# Patient Record
Sex: Male | Born: 1970 | Race: Black or African American | Hispanic: Yes | Marital: Single | Smoking: Never smoker
Health system: Southern US, Community
[De-identification: ages and names within clinical notes are randomized; demographics above are authoritative.]

## PROBLEM LIST (undated history)

## (undated) DIAGNOSIS — J45909 Unspecified asthma, uncomplicated: Secondary | ICD-10-CM

## (undated) HISTORY — PX: HERNIA REPAIR: SHX51

## (undated) HISTORY — PX: APPENDECTOMY: SHX54

## (undated) HISTORY — PX: INGUINAL HERNIA REPAIR: SUR1180

---

## 2017-04-22 ENCOUNTER — Emergency Department (HOSPITAL_COMMUNITY)
Admission: EM | Admit: 2017-04-22 | Discharge: 2017-04-22 | Disposition: A | Discharge status: Home / Self Care | Payer: Self-pay | Attending: Emergency Medicine | Admitting: Emergency Medicine

## 2017-04-22 DIAGNOSIS — R51 Headache: Secondary | ICD-10-CM | POA: Insufficient documentation

## 2017-04-22 DIAGNOSIS — R519 Headache, unspecified: Secondary | ICD-10-CM

## 2017-04-22 DIAGNOSIS — Z79899 Other long term (current) drug therapy: Secondary | ICD-10-CM | POA: Insufficient documentation

## 2017-04-22 MED ORDER — KETOROLAC TROMETHAMINE 30 MG/ML IJ SOLN
15.0000 mg | Freq: Once | INTRAMUSCULAR | Status: AC
  Administered 2017-04-22: 15 mg via INTRAVENOUS
  Filled 2017-04-22: qty 1

## 2017-04-22 MED ORDER — SUMATRIPTAN SUCCINATE 50 MG PO TABS: 50.0000 mg | ORAL_TABLET | ORAL | 0 refills | Status: DC | PRN

## 2017-04-22 MED ORDER — PROCHLORPERAZINE EDISYLATE 5 MG/ML IJ SOLN
10.0000 mg | Freq: Once | INTRAMUSCULAR | Status: AC
  Administered 2017-04-22: 10 mg via INTRAVENOUS
  Filled 2017-04-22: qty 2

## 2017-04-22 MED ORDER — SODIUM CHLORIDE 0.9 % IV BOLUS (SEPSIS)
1000.0000 mL | Freq: Once | INTRAVENOUS | Status: AC
  Administered 2017-04-22: 1000 mL via INTRAVENOUS

## 2017-04-22 NOTE — ED Triage Notes (Signed)
Pt reports history of migraines and has been having a headache for 1 week. Pt states this headache has been waking him up at night and keeping him up all day. Pt has been taking OTC headache medications with no relief.

## 2017-04-22 NOTE — Discharge Instructions (Signed)
As discussed, your evaluation today has been largely reassuring.  But, it is important that you monitor your condition carefully, and do not hesitate to return to the ED if you develop new, or concerning changes in your condition. ? ?Otherwise, please follow-up with your physician for appropriate ongoing care. ? ?

## 2017-04-22 NOTE — ED Provider Notes (Signed)
[facility] EMERGENCY DEPARTMENT Provider Note   CSN: [id number] Arrival date & time: 04/22/17  3419     History   Chief Complaint Chief Complaint  Patient presents with  . Migraine    HPI Kenyan Karnes is a 46 y.o. male.  HPI  Patient presents with concern of headache. Headache has been present for about 2 weeks, has been adamantly severe, but persistently present. Headache is in a typical distribution for him with history of prior migraine. Headache is most severe with flares in the anterior left. There is no new nausea, vomiting, photophobia, confusion, disorientation, weakness in his extremities, incontinence. Patient has had no relief with OTC medication. Patient was recently moved here from [place], has no primary care physician, no access to health care currently.   No past medical history on file.  There are no active problems to display for this patient.   No past surgical history on file.     Home Medications    Prior to Admission medications   Medication Sig Start Date End Date Taking? Authorizing Provider  albuterol (PROVENTIL HFA;VENTOLIN HFA) 108 (90 Base) MCG/ACT inhaler Inhale 2 puffs into the lungs every 6 (six) hours as needed for wheezing or shortness of breath.   Yes [provider]  Multiple Vitamins-Minerals (MULTIVITAMIN WITH MINERALS) tablet Take 1 tablet by mouth daily.   Yes [provider]  SUMAtriptan (IMITREX) 50 MG tablet Take 1 tablet (50 mg total) by mouth every 2 (two) hours as needed for migraine. May repeat (one time) in 2 hours if headache persists or recurs 04/22/17   Carmin Muskrat, MD    Family History No family history on file.  Social History Social History  Substance Use Topics  . Smoking status: Not on file  . Smokeless tobacco: Not on file  . Alcohol use Not on file     Allergies   Shellfish allergy   Review of Systems Review of Systems  Constitutional:       Per  HPI, otherwise negative  HENT:       Per HPI, otherwise negative  Respiratory:       Per HPI, otherwise negative  Cardiovascular:       Per HPI, otherwise negative  Gastrointestinal: Negative for vomiting.  Endocrine:       Negative aside from HPI  Genitourinary:       Neg aside from HPI   Musculoskeletal:       Per HPI, otherwise negative  Skin: Negative.   Neurological: Positive for headaches. Negative for syncope.     Physical Exam Updated Vital Signs BP (!) 153/80   Pulse 84   Temp 98.6 F (37 C) (Oral)   Resp 17   SpO2 92%   Physical Exam  Constitutional: He is oriented to person, place, and time. He appears well-developed. No distress.  HENT:  Head: Normocephalic and atraumatic.  Eyes: Conjunctivae and EOM are normal.  Cardiovascular: Normal rate and regular rhythm.   Pulmonary/Chest: Effort normal. No stridor. No respiratory distress.  Abdominal: He exhibits no distension.  Musculoskeletal: He exhibits no edema.  Neurological: He is alert and oriented to person, place, and time. He displays no atrophy and no tremor. No cranial nerve deficit. He exhibits normal muscle tone. He displays no seizure activity.  Skin: Skin is warm and dry.  Psychiatric: He has a normal mood and affect.  Nursing note and vitals reviewed.    ED Treatments / Results   Procedures Procedures (  including critical care time)  Medications Ordered in ED Medications  sodium chloride 0.9 % bolus 1,000 mL (0 mLs Intravenous Stopped 04/22/17 1102)  ketorolac (TORADOL) 30 MG/ML injection 15 mg (15 mg Intravenous Given 04/22/17 1014)  prochlorperazine (COMPAZINE) injection 10 mg (10 mg Intravenous Given 04/22/17 1012)     Initial Impression / Assessment and Plan / ED Course  I have reviewed the triage vital signs and the nursing notes.  Pertinent labs & imaging results that were available during my care of the patient were reviewed by me and considered in my medical decision making (see  chart for details).    On repeat exam the patient is awake and alert, in no distress.  Headache has improved. With no red flags, and with a history of migraines, the patient was provided outpatient follow-up referral, and prescription for migraine headaches.   Final Clinical Impressions(s) / ED Diagnoses   Final diagnoses:  Bad headache    New Prescriptions New Prescriptions   SUMATRIPTAN (IMITREX) 50 MG TABLET    Take 1 tablet (50 mg total) by mouth every 2 (two) hours as needed for migraine. May repeat (one time) in 2 hours if headache persists or recurs     Carmin Muskrat, MD 04/22/17 1156

## 2018-02-06 ENCOUNTER — Other Ambulatory Visit: Payer: Self-pay

## 2018-02-06 ENCOUNTER — Emergency Department (HOSPITAL_COMMUNITY)
Admission: EM | Admit: 2018-02-06 | Discharge: 2018-02-06 | Disposition: A | Discharge status: Home / Self Care | Payer: BLUE CROSS/BLUE SHIELD | Attending: Emergency Medicine | Admitting: Emergency Medicine

## 2018-02-06 ENCOUNTER — Emergency Department (HOSPITAL_COMMUNITY): Payer: BLUE CROSS/BLUE SHIELD

## 2018-02-06 ENCOUNTER — Encounter (HOSPITAL_COMMUNITY): Payer: Self-pay | Admitting: *Deleted

## 2018-02-06 DIAGNOSIS — R748 Abnormal levels of other serum enzymes: Secondary | ICD-10-CM | POA: Diagnosis not present

## 2018-02-06 DIAGNOSIS — N23 Unspecified renal colic: Secondary | ICD-10-CM | POA: Insufficient documentation

## 2018-02-06 DIAGNOSIS — Z79899 Other long term (current) drug therapy: Secondary | ICD-10-CM | POA: Diagnosis not present

## 2018-02-06 DIAGNOSIS — R1031 Right lower quadrant pain: Secondary | ICD-10-CM | POA: Diagnosis present

## 2018-02-06 LAB — URINALYSIS, ROUTINE W REFLEX MICROSCOPIC
BILIRUBIN URINE: NEGATIVE
Bacteria, UA: NONE SEEN
Glucose, UA: NEGATIVE mg/dL
KETONES UR: 5 mg/dL — AB
LEUKOCYTES UA: NEGATIVE
NITRITE: NEGATIVE
PH: 6 (ref 5.0–8.0)
Protein, ur: NEGATIVE mg/dL
SPECIFIC GRAVITY, URINE: 1.017 (ref 1.005–1.030)

## 2018-02-06 LAB — COMPREHENSIVE METABOLIC PANEL
ALBUMIN: 4.3 g/dL (ref 3.5–5.0)
ALK PHOS: 51 U/L (ref 38–126)
ALT: 106 U/L — AB (ref 0–44)
ANION GAP: 15 (ref 5–15)
AST: 80 U/L — ABNORMAL HIGH (ref 15–41)
BILIRUBIN TOTAL: 0.9 mg/dL (ref 0.3–1.2)
BUN: 12 mg/dL (ref 6–20)
CALCIUM: 9.5 mg/dL (ref 8.9–10.3)
CO2: 22 mmol/L (ref 22–32)
CREATININE: 1.02 mg/dL (ref 0.61–1.24)
Chloride: 101 mmol/L (ref 98–111)
GFR calc Af Amer: >60 mL/min (ref >60)
GFR calc non Af Amer: >60 mL/min (ref >60)
GLUCOSE: 143 mg/dL — AB (ref 70–99)
Potassium: 3.4 mmol/L — ABNORMAL LOW (ref 3.5–5.1)
Sodium: 138 mmol/L (ref 135–145)
TOTAL PROTEIN: 7.4 g/dL (ref 6.5–8.1)

## 2018-02-06 LAB — CBC
HEMATOCRIT: 40.9 % (ref 39.0–52.0)
HEMOGLOBIN: 14.1 g/dL (ref 13.0–17.0)
MCH: 31.2 pg (ref 26.0–34.0)
MCHC: 34.5 g/dL (ref 30.0–36.0)
MCV: 90.5 fL (ref 78.0–100.0)
PLATELETS: 199 10*3/uL (ref 150–400)
RBC: 4.52 MIL/uL (ref 4.22–5.81)
RDW: 12 % (ref 11.5–15.5)
WBC: 6.4 10*3/uL (ref 4.0–10.5)

## 2018-02-06 LAB — LIPASE, BLOOD: Lipase: 41 U/L (ref 11–51)

## 2018-02-06 MED ORDER — SODIUM CHLORIDE 0.9 % IV BOLUS
1000.0000 mL | Freq: Once | INTRAVENOUS | Status: AC
  Administered 2018-02-06: 1000 mL via INTRAVENOUS

## 2018-02-06 MED ORDER — HYDROMORPHONE HCL 1 MG/ML IJ SOLN
0.5000 mg | Freq: Once | INTRAMUSCULAR | Status: AC
  Administered 2018-02-06: 0.5 mg via INTRAVENOUS
  Filled 2018-02-06: qty 1

## 2018-02-06 MED ORDER — OXYCODONE-ACETAMINOPHEN 5-325 MG PO TABS: 1.0000 | ORAL_TABLET | ORAL | 0 refills | Status: DC | PRN

## 2018-02-06 MED ORDER — ONDANSETRON HCL 4 MG/2ML IJ SOLN
4.0000 mg | Freq: Once | INTRAMUSCULAR | Status: AC
  Administered 2018-02-06: 4 mg via INTRAVENOUS
  Filled 2018-02-06: qty 2

## 2018-02-06 MED ORDER — TAMSULOSIN HCL 0.4 MG PO CAPS: 0.4000 mg | ORAL_CAPSULE | Freq: Every day | ORAL | 0 refills | Status: DC

## 2018-02-06 MED ORDER — NAPROXEN 500 MG PO TABS: 500.0000 mg | ORAL_TABLET | Freq: Two times a day (BID) | ORAL | 0 refills | Status: DC

## 2018-02-06 MED ORDER — KETOROLAC TROMETHAMINE 15 MG/ML IJ SOLN
15.0000 mg | Freq: Once | INTRAMUSCULAR | Status: AC
  Administered 2018-02-06: 15 mg via INTRAVENOUS
  Filled 2018-02-06: qty 1

## 2018-02-06 NOTE — ED Provider Notes (Signed)
[facility] EMERGENCY DEPARTMENT Provider Note   CSN: [id number] Arrival date & time: 02/06/18  0232     History   Chief Complaint Chief Complaint  Patient presents with  . Abdominal Pain    HPI Stephen Lozano is a 47 y.o. male.  Patient with history of appendectomy at age 35 presents to the emergency department with acute onset of right lower quadrant abdominal pain with radiation to the groin.  Pain began acutely at about 11 PM.  It was intermittent at first but became more persistent at about 1:30 AM.  Patient had some right back and flank pain at onset.  He has had nausea but no vomiting.  Pain has been severe at times.  No treatments prior to arrival.  No previous history of kidney stones.  Patient admits to occasional alcohol use, most recently last night.  No dysuria or hematuria reported.     History reviewed. No pertinent past medical history.  There are no active problems to display for this patient.   History reviewed. No pertinent surgical history.      Home Medications    Prior to Admission medications   Medication Sig Start Date End Date Taking? Authorizing Provider  albuterol (PROVENTIL HFA;VENTOLIN HFA) 108 (90 Base) MCG/ACT inhaler Inhale 2 puffs into the lungs every 6 (six) hours as needed for wheezing or shortness of breath.   Yes [provider]  Multiple Vitamins-Minerals (MULTIVITAMIN WITH MINERALS) tablet Take 1 tablet by mouth daily.   Yes [provider]  SUMAtriptan (IMITREX) 50 MG tablet Take 1 tablet (50 mg total) by mouth every 2 (two) hours as needed for migraine. May repeat (one time) in 2 hours if headache persists or recurs 04/22/17  Yes Carmin Muskrat, MD    Family History No family history on file.  Social History Social History   Tobacco Use  . Smoking status: Never Smoker  . Smokeless tobacco: Never Used  Substance Use Topics  . Alcohol use: Never    Frequency: Never  . Drug use: Not on  file     Allergies   Shellfish allergy   Review of Systems Review of Systems  Constitutional: Negative for fever.  HENT: Negative for rhinorrhea and sore throat.   Eyes: Negative for redness.  Respiratory: Negative for cough.   Cardiovascular: Negative for chest pain.  Gastrointestinal: Positive for abdominal pain. Negative for diarrhea, nausea and vomiting.  Genitourinary: Positive for flank pain. Negative for dysuria, frequency, hematuria, testicular pain and urgency.  Musculoskeletal: Positive for back pain. Negative for myalgias.  Skin: Negative for rash.  Neurological: Negative for headaches.     Physical Exam Updated Vital Signs BP (!) 151/89 (BP Location: Left Arm)   Pulse 69   Temp 98 F (36.7 C) (Oral)   Resp 20   Ht 6' (1.829 m)   Wt 96.6 kg (213 lb)   SpO2 97%   BMI 28.89 kg/m   Physical Exam  Constitutional: He appears well-developed and well-nourished. He appears distressed (slightly).  HENT:  Head: Normocephalic and atraumatic.  Eyes: Conjunctivae are normal. Right eye exhibits no discharge. Left eye exhibits no discharge.  Neck: Normal range of motion. Neck supple.  Cardiovascular: Normal rate, regular rhythm and normal heart sounds.  Pulmonary/Chest: Effort normal and breath sounds normal.  Abdominal: Soft. There is tenderness in the right lower quadrant. There is no rebound, no guarding and no CVA tenderness.  Neurological: He is alert.  Skin: Skin is warm and dry.  Psychiatric: He has a normal mood and affect.  Nursing note and vitals reviewed.    ED Treatments / Results  Labs (all labs ordered are listed, but only abnormal results are displayed) Labs Reviewed  COMPREHENSIVE METABOLIC PANEL - Abnormal; Notable for the following components:      Result Value   Potassium 3.4 (*)    Glucose, Bld 143 (*)    AST 80 (*)    ALT 106 (*)    All other components within normal limits  URINALYSIS, ROUTINE W REFLEX MICROSCOPIC - Abnormal; Notable  for the following components:   Hgb urine dipstick SMALL (*)    Ketones, ur 5 (*)    All other components within normal limits  LIPASE, BLOOD  CBC    EKG EKG Interpretation  Date/Time:  Saturday February 06 2018 02:40:01 EDT Ventricular Rate:  69 PR Interval:  188 QRS Duration: 120 QT Interval:  392 QTC Calculation: 420 R Axis:   -31 Text Interpretation:  Normal sinus rhythm Left axis deviation Right bundle branch block Abnormal ECG No previous ECGs available Confirmed by Ripley Fraise [phone]) on 02/06/2018 5:03:40 AM   Radiology No results found.  Procedures Procedures (including critical care time)  Medications Ordered in ED Medications  HYDROmorphone (DILAUDID) injection 0.5 mg (0.5 mg Intravenous Given 02/06/18 0642)  ondansetron (ZOFRAN) injection 4 mg (4 mg Intravenous Given 02/06/18 0642)  sodium chloride 0.9 % bolus 1,000 mL (0 mLs Intravenous Stopped 02/06/18 0756)  ketorolac (TORADOL) 15 MG/ML injection 15 mg (15 mg Intravenous Given 02/06/18 0823)     Initial Impression / Assessment and Plan / ED Course  I have reviewed the triage vital signs and the nursing notes.  Pertinent labs & imaging results that were available during my care of the patient were reviewed by me and considered in my medical decision making (see chart for details).     Patient seen and examined.  Clinical symptoms suggestive of right sided ureteral colic.  Patient with no previous history.  CT renal protocol ordered to evaluate further.  Patient has mildly elevated transaminases which may be likely due to recent alcohol use.  He has no focal right upper quadrant pain to suggest gallbladder disease at this time.  Vital signs reviewed and are as follows: BP (!) 151/89 (BP Location: Left Arm)   Pulse 69   Temp 98 F (36.7 C) (Oral)   Resp 20   Ht 6' (1.829 m)   Wt 96.6 kg (213 lb)   SpO2 97%   BMI 28.89 kg/m   8:29 AM patient's pain is much improved.  Will give dose of Toradol to help  prevent pain from returning.  Patient updated on CT imaging results.  We also discussed findings of fatty liver infiltration with elevated liver enzymes and the need to have these followed up by primary care doctor.  Discussed that this may be genetic or exacerbated by alcohol.  Patient states that he recently moved here and does not yet have a doctor.  Will give referrals.  Anticipate discharge to home if pain remains improved.  Patient counseled on kidney stone treatment. Urged patient to strain urine and save any stones. Urged urology follow-up and return to [facility] with any complications. Counseled patient to maintain good fluid intake.   Counseled patient on use of Flomax.   Patient counseled on use of narcotic pain medications. Counseled not to combine these medications with others containing tylenol. Urged not to drink alcohol, drive, or perform any other  activities that requires focus while taking these medications. The patient verbalizes understanding and agrees with the plan.  9:06 AM patient doing well.  Ready for discharge.  Questions answered.   Final Clinical Impressions(s) / ED Diagnoses   Final diagnoses:  Ureteral colic  Elevated liver enzymes   Ureteral colic: Patient with right-sided renal colic, confirmed on CT scan.  Pain well controlled in the emergency department.  Stone is 2 mm and expect that he will pass this with time.  Urology follow-up given.  Home with symptomatic treatment and Flomax.  No concern for pyelonephritis.  Elevated liver enzymes: Patient with fatty liver infiltration noted on the CT scan.  Patient has no previous diagnosis.  Liver enzymes slightly elevated today.  Patient strongly encouraged to follow-up with primary care doctor to have these rechecked and monitored.  ED Discharge Orders        Ordered    naproxen (NAPROSYN) 500 MG tablet  2 times daily     02/06/18 0903    oxyCODONE-acetaminophen (PERCOCET/ROXICET) 5-325 MG tablet  Every 4 hours PRN       02/06/18 0903    tamsulosin (FLOMAX) 0.4 MG CAPS capsule  Daily     02/06/18 0903       Carlisle Cater, PA-C 02/06/18 2376    Virgel Manifold, MD 02/07/18 [phone]

## 2018-02-06 NOTE — ED Triage Notes (Signed)
Th pt is c/o rt lower abd pain  At 2330  Sudden onset  No vomiting nauseated writhing in pain at triage

## 2018-02-06 NOTE — ED Notes (Signed)
Patient transported to CT 

## 2018-02-06 NOTE — ED Notes (Signed)
Patient verbalizes understanding of discharge instructions. Opportunity for questioning and answers were provided. Armband removed by staff, pt discharged from ED.  

## 2018-02-06 NOTE — Discharge Instructions (Signed)
Please read and follow all provided instructions.  Your diagnoses today include:  1. Ureteral colic   2. Elevated liver enzymes     Tests performed today include:  Urine test that showed blood in your urine and no infection  CT scan which showed a 2 millimeter kidney on the right side, also fatty infiltration of the liver  Blood test that showed normal kidney function, elevated liver enzymes which will need to be rechecked and followed by a primary care doctor  Vital signs. See below for your results today.   Medications prescribed:   Percocet (oxycodone/acetaminophen) - narcotic pain medication  DO NOT drive or perform any activities that require you to be awake and alert because this medicine can make you drowsy. BE VERY CAREFUL not to take multiple medicines containing Tylenol (also called acetaminophen). Doing so can lead to an overdose which can damage your liver and cause liver failure and possibly death.   Naproxen - anti-inflammatory pain medication  Do not exceed 500mg  naproxen every 12 hours, take with food  You have been prescribed an anti-inflammatory medication or NSAID. Take with food. Take smallest effective dose for the shortest duration needed for your pain. Stop taking if you experience stomach pain or vomiting.    Flomax (tamsulosin) - relaxes smooth muscle to help kidney stones pass  Take any prescribed medications only as directed.  Home care instructions:  Follow any educational materials contained in this packet.  Please double your fluid intake for the next several days. Strain your urine and save any stones that may pass.   BE VERY CAREFUL not to take multiple medicines containing Tylenol (also called acetaminophen). Doing so can lead to an overdose which can damage your liver and cause liver failure and possibly death.   Follow-up instructions: Please follow-up with your urologist or the urologist referral (provided on front page) in the next 1  week for further evaluation of your symptoms.  Return instructions:  If you need to return to the Emergency Department, go to [facility] and not [facility]. The urologists are located at [facility] and can better care for you at this location.   Please return to the Emergency Department if you experience worsening symptoms.  Please return if you develop fever or uncontrolled pain or vomiting.  Please return if you have any other emergent concerns.  Additional Information:  Your vital signs today were: BP 131/74    Pulse 67    Temp 98 F (36.7 C) (Oral)    Resp (!) 22    Ht 6' (1.829 m)    Wt 96.6 kg (213 lb)    SpO2 98%    BMI 28.89 kg/m  If your blood pressure (BP) was elevated above 135/85 this visit, please have this repeated by your doctor within one month. --------------

## 2020-04-18 ENCOUNTER — Other Ambulatory Visit: Payer: Self-pay

## 2020-04-18 ENCOUNTER — Emergency Department (HOSPITAL_COMMUNITY): Payer: Self-pay

## 2020-04-18 ENCOUNTER — Emergency Department (HOSPITAL_COMMUNITY)
Admission: EM | Admit: 2020-04-18 | Discharge: 2020-04-19 | Disposition: A | Discharge status: Home / Self Care | Payer: Self-pay | Attending: Emergency Medicine | Admitting: Emergency Medicine

## 2020-04-18 ENCOUNTER — Ambulatory Visit (HOSPITAL_COMMUNITY)
Admission: EM | Admit: 2020-04-18 | Discharge: 2020-04-18 | Disposition: A | Discharge status: Home / Self Care | Payer: Self-pay | Attending: Internal Medicine | Admitting: Internal Medicine

## 2020-04-18 ENCOUNTER — Encounter (HOSPITAL_COMMUNITY): Payer: Self-pay

## 2020-04-18 DIAGNOSIS — R079 Chest pain, unspecified: Secondary | ICD-10-CM

## 2020-04-18 DIAGNOSIS — J45909 Unspecified asthma, uncomplicated: Secondary | ICD-10-CM | POA: Insufficient documentation

## 2020-04-18 DIAGNOSIS — R072 Precordial pain: Secondary | ICD-10-CM | POA: Insufficient documentation

## 2020-04-18 HISTORY — DX: Unspecified asthma, uncomplicated: J45.909

## 2020-04-18 LAB — CBC
HCT: 45 % (ref 39.0–52.0)
Hemoglobin: 15 g/dL (ref 13.0–17.0)
MCH: 31.3 pg (ref 26.0–34.0)
MCHC: 33.3 g/dL (ref 30.0–36.0)
MCV: 93.8 fL (ref 80.0–100.0)
Platelets: 203 10*3/uL (ref 150–400)
RBC: 4.8 MIL/uL (ref 4.22–5.81)
RDW: 11.9 % (ref 11.5–15.5)
WBC: 6.3 10*3/uL (ref 4.0–10.5)
nRBC: 0 % (ref 0.0–0.2)

## 2020-04-18 LAB — BASIC METABOLIC PANEL
Anion gap: 10 (ref 5–15)
BUN: 10 mg/dL (ref 6–20)
CO2: 24 mmol/L (ref 22–32)
Calcium: 9.7 mg/dL (ref 8.9–10.3)
Chloride: 104 mmol/L (ref 98–111)
Creatinine, Ser: 0.93 mg/dL (ref 0.61–1.24)
GFR, Estimated: >60 mL/min (ref >60)
Glucose, Bld: 89 mg/dL (ref 70–99)
Potassium: 4 mmol/L (ref 3.5–5.1)
Sodium: 138 mmol/L (ref 135–145)

## 2020-04-18 LAB — TROPONIN I (HIGH SENSITIVITY)
Troponin I (High Sensitivity): 9 ng/L (ref <18)
Troponin I (High Sensitivity): 11 ng/L (ref <18)

## 2020-04-18 NOTE — ED Triage Notes (Signed)
Pt reports Sunday he woke up with L sided numbness, which has continued in arm only until present. Also reports L sided non-radiating chest pain. Went to [facility] but sent here for further eval.

## 2020-04-18 NOTE — Discharge Instructions (Signed)
Patient is advised to go to the ED for further evaluation of chest pain

## 2020-04-18 NOTE — ED Notes (Signed)
Patient is being discharged from the Urgent Care and sent to the Emergency Department via POV. Per Dr. Lanny Cramp, patient is in need of higher level of care due to CP, left arm numbness. Patient is aware and verbalizes understanding of plan of care.  Vitals:   04/18/20 1559  BP: (!) 186/84  Pulse: 75  Resp: 18  Temp: 98.9 F (37.2 C)  SpO2: 96%

## 2020-04-18 NOTE — ED Triage Notes (Signed)
Pt c/o left arm numbness/tingling and left leg numbness onset upon waking up Sunday morning. Pt reports left leg numbness improved by Sunday night, states left arm numbness/tingling persists. Also reports mid-sternal/epigastric pain "like someone is stepping on my chest" onset Monday evening, pain to left scapula pain. CP is not reproducible on palpation, no change with inspiration/movement., Pt states h/o "heart irritation", but reports that current CP is very different in characteristic. Previous CP felt like "tight band".   Denies n/v, SOB, diaphoresis, slurred speech, blurred vision, weakness to left side.  Speech clear, grips strong, left slightly weaker than right, PERRLA, smile symmetrical.  EKG performed and given to Dr. Lanny Cramp

## 2020-04-18 NOTE — ED Provider Notes (Signed)
[facility]    CSN: [id number] Arrival date & time: 04/18/20  1528      History   Chief Complaint Chief Complaint  Patient presents with  . Chest Pain  . left arm numbness    HPI Stephen Lozano is a 49 y.o. male severe urgent care with 3-day history of left arm numbness and tingling which started after patient woke up out of bed.  He denies any weakness in the left upper extremity.  No facial numbness or leg numbness.  No facial deviation.  Patient denies any difficulty finding his words feeling confused.  Symptoms have persisted over the past 3 days.  He admits to sleeping in the very awkward position.  No rash.  Patient also complains of lower chest pain which is intermittent in nature.  Pain is mild to moderate in intensity and is currently about a 3 out of 10.  No radiation of pain.  Is not associated with any oral intake.  No nausea or vomiting.  No shortness of breath or wheezing.  Pain is currently pressure type.  Last stress test was about 5 years ago.  He has some cardiac history.Marland Kitchen   HPI  Past Medical History:  Diagnosis Date  . Asthma     There are no problems to display for this patient.   Past Surgical History:  Procedure Laterality Date  . APPENDECTOMY    . HERNIA REPAIR    . INGUINAL HERNIA REPAIR Right        Home Medications    Prior to Admission medications   Medication Sig Start Date End Date Taking? Authorizing Provider  albuterol (PROVENTIL HFA;VENTOLIN HFA) 108 (90 Base) MCG/ACT inhaler Inhale 2 puffs into the lungs every 6 (six) hours as needed for wheezing or shortness of breath.    [provider]  Multiple Vitamins-Minerals (MULTIVITAMIN WITH MINERALS) tablet Take 1 tablet by mouth daily.    [provider]  naproxen (NAPROSYN) 500 MG tablet Take 1 tablet (500 mg total) by mouth 2 (two) times daily. 02/06/18   Carlisle Cater, PA-C  oxyCODONE-acetaminophen (PERCOCET/ROXICET) 5-325 MG tablet Take 1-2 tablets by  mouth every 4 (four) hours as needed for severe pain. 02/06/18   Carlisle Cater, PA-C  SUMAtriptan (IMITREX) 50 MG tablet Take 1 tablet (50 mg total) by mouth every 2 (two) hours as needed for migraine. May repeat (one time) in 2 hours if headache persists or recurs 04/22/17   Carmin Muskrat, MD  tamsulosin (FLOMAX) 0.4 MG CAPS capsule Take 1 capsule (0.4 mg total) by mouth daily. 02/06/18   Carlisle Cater, PA-C    Family History Family History  Problem Relation Age of Onset  . Diabetes Mother   . Diabetes Father     Social History Social History   Tobacco Use  . Smoking status: Never Smoker  . Smokeless tobacco: Never Used  Vaping Use  . Vaping Use: Never used  Substance Use Topics  . Alcohol use: Yes    Comment: occasional  . Drug use: Never     Allergies   Shellfish allergy   Review of Systems Review of Systems  Constitutional: Negative.   HENT: Negative.   Respiratory: Negative for cough and shortness of breath.   Cardiovascular: Positive for chest pain. Negative for palpitations.  Gastrointestinal: Negative.   Genitourinary: Negative.  Negative for dysuria, frequency and urgency.  Neurological: Positive for headaches. Negative for dizziness, facial asymmetry and light-headedness.     Physical Exam Triage Vital Signs ED  Triage Vitals  Enc Vitals Group     BP 04/18/20 1559 (!) 186/84     Pulse Rate 04/18/20 1559 75     Resp 04/18/20 1559 18     Temp 04/18/20 1559 98.9 F (37.2 C)     Temp Source 04/18/20 1559 Oral     SpO2 04/18/20 1559 96 %     Weight --      Height --      Head Circumference --      Peak Flow --      Pain Score 04/18/20 1600 4     Pain Loc --      Pain Edu? --      Excl. in [place]? --    No data found.  Updated Vital Signs BP (!) 186/84 (BP Location: Right Arm)   Pulse 75   Temp 98.9 F (37.2 C) (Oral)   Resp 18   SpO2 96%   Visual Acuity Right Eye Distance:   Left Eye Distance:   Bilateral Distance:    Right Eye Near:     Left Eye Near:    Bilateral Near:     Physical Exam Vitals and nursing note reviewed.  Constitutional:      General: He is not in acute distress.    Appearance: He is not ill-appearing.  Cardiovascular:     Rate and Rhythm: Normal rate and regular rhythm.     Heart sounds: Normal heart sounds.  Pulmonary:     Effort: Pulmonary effort is normal.     Breath sounds: Normal breath sounds. No decreased breath sounds, wheezing or rhonchi.  Abdominal:     General: Bowel sounds are normal.     Palpations: Abdomen is soft.  Neurological:     Mental Status: He is alert.      UC Treatments / Results  Labs (all labs ordered are listed, but only abnormal results are displayed) Labs Reviewed - No data to display  EKG   Radiology DG Chest 2 View  Result Date: 04/18/2020 CLINICAL DATA:  49 year old male with chest pain. EXAM: CHEST - 2 VIEW COMPARISON:  None. FINDINGS: The lungs are clear. There is no pleural effusion pneumothorax. The cardiac silhouette is within limits. No acute osseous pathology. Pectus excavatum. IMPRESSION: No active cardiopulmonary disease. Electronically Signed   By: Anner Crete M.D.   On: 04/18/2020 17:18    Procedures Procedures (including critical care time)  Medications Ordered in UC Medications - No data to display  Initial Impression / Assessment and Plan / UC Course  I have reviewed the triage vital signs and the nursing notes.  Pertinent labs & imaging results that were available during my care of the patient were reviewed by me and considered in my medical decision making (see chart for details).     1.  Precordial chest pain: EKG showed normal sinus rhythm with left axis deviation.  Incomplete right bundle branch block. Patient continues to have some chest discomfort at this time I encouraged the patient to go to the emergency department to be evaluated further Patient agrees with the plan.  2.  Left upper extremity numbness likely  cervical radiculopathy: Patient can get further work-up in the ED. Final Clinical Impressions(s) / UC Diagnoses   Final diagnoses:  Chest pain, unspecified type     Discharge Instructions     Patient is advised to go to the ED for further evaluation of chest pain    ED Prescriptions    None  PDMP not reviewed this encounter.   Chase Picket, MD 04/18/20 [phone]

## 2020-04-19 MED ORDER — LIDOCAINE VISCOUS HCL 2 % MT SOLN
15.0000 mL | Freq: Once | OROMUCOSAL | Status: AC
  Administered 2020-04-19: 15 mL via ORAL
  Filled 2020-04-19: qty 15

## 2020-04-19 MED ORDER — ALUM & MAG HYDROXIDE-SIMETH 200-200-20 MG/5ML PO SUSP
30.0000 mL | Freq: Once | ORAL | Status: AC
  Administered 2020-04-19: 30 mL via ORAL
  Filled 2020-04-19: qty 30

## 2020-04-19 NOTE — ED Notes (Signed)
Pt's fiance would like a call back from charge nurse. [place] [phone]

## 2020-04-19 NOTE — ED Provider Notes (Signed)
[facility] EMERGENCY DEPARTMENT Provider Note  CSN: [id number] Arrival date & time: 04/18/20 1630  Chief Complaint(s) Chest Pain and Numbness  HPI Stephen Lozano is a 49 y.o. male here with chest pain  Onset/Duration: 4 days (Sun), gradual Timing: constant, unchanged Location: precordial, radiating to left arm Quality: pressure Severity: moderate Modifying Factors:  Improved by: leaning forward  Worsened by: sitting up Associated Signs/Symptoms:  Pertinent (+): none  Pertinent (-): SOB, fever, recent infection, cough, abd pain, N/V Context: h/o prior pericarditis. Pain similar. Last episode 3-4 yrs ago.   HPI  Past Medical History Past Medical History:  Diagnosis Date  . Asthma    There are no problems to display for this patient.  Home Medication(s) Prior to Admission medications   Medication Sig Start Date End Date Taking? Authorizing Provider  albuterol (PROVENTIL HFA;VENTOLIN HFA) 108 (90 Base) MCG/ACT inhaler Inhale 2 puffs into the lungs every 6 (six) hours as needed for wheezing or shortness of breath.    [provider]  Multiple Vitamins-Minerals (MULTIVITAMIN WITH MINERALS) tablet Take 1 tablet by mouth daily.    [provider]  naproxen (NAPROSYN) 500 MG tablet Take 1 tablet (500 mg total) by mouth 2 (two) times daily. 02/06/18   Carlisle Cater, PA-C  oxyCODONE-acetaminophen (PERCOCET/ROXICET) 5-325 MG tablet Take 1-2 tablets by mouth every 4 (four) hours as needed for severe pain. 02/06/18   Carlisle Cater, PA-C  SUMAtriptan (IMITREX) 50 MG tablet Take 1 tablet (50 mg total) by mouth every 2 (two) hours as needed for migraine. May repeat (one time) in 2 hours if headache persists or recurs 04/22/17   Carmin Muskrat, MD  tamsulosin (FLOMAX) 0.4 MG CAPS capsule Take 1 capsule (0.4 mg total) by mouth daily. 02/06/18   Carlisle Cater, PA-C                                                                                                                                     Past Surgical History Past Surgical History:  Procedure Laterality Date  . APPENDECTOMY    . HERNIA REPAIR    . INGUINAL HERNIA REPAIR Right    Family History Family History  Problem Relation Age of Onset  . Diabetes Mother   . Diabetes Father     Social History Social History   Tobacco Use  . Smoking status: Never Smoker  . Smokeless tobacco: Never Used  Vaping Use  . Vaping Use: Never used  Substance Use Topics  . Alcohol use: Yes    Comment: occasional  . Drug use: Never   Allergies Shellfish allergy  Review of Systems Review of Systems All other systems are reviewed and are negative for acute change except as noted in the HPI  Physical Exam Vital Signs  I have reviewed the triage vital signs BP (!) 164/77   Pulse 65   Temp 98 F (36.7 C)   Resp 20  Ht 6' (1.829 m)   Wt 96.2 kg   SpO2 97%   BMI 28.75 kg/m   Physical Exam Vitals reviewed.  Constitutional:      General: He is not in acute distress.    Appearance: He is well-developed. He is not diaphoretic.  HENT:     Head: Normocephalic and atraumatic.     Nose: Nose normal.  Eyes:     General: No scleral icterus.       Right eye: No discharge.        Left eye: No discharge.     Conjunctiva/sclera: Conjunctivae normal.     Pupils: Pupils are equal, round, and reactive to light.  Cardiovascular:     Rate and Rhythm: Normal rate and regular rhythm.     Heart sounds: No murmur heard.  No friction rub. No gallop.   Pulmonary:     Effort: Pulmonary effort is normal. No respiratory distress.     Breath sounds: Normal breath sounds. No stridor. No rales.  Abdominal:     General: There is no distension.     Palpations: Abdomen is soft.     Tenderness: There is no abdominal tenderness.  Musculoskeletal:        General: No tenderness.     Cervical back: Normal range of motion and neck supple.  Skin:    General: Skin is warm and dry.     Findings: No  erythema or rash.  Neurological:     Mental Status: He is alert and oriented to person, place, and time.     ED Results and Treatments Labs (all labs ordered are listed, but only abnormal results are displayed) Labs Reviewed  BASIC METABOLIC PANEL  CBC  TROPONIN I (HIGH SENSITIVITY)  TROPONIN I (HIGH SENSITIVITY)                                                                                                                         EKG  EKG Interpretation  Date/Time:    Ventricular Rate:    PR Interval:    QRS Duration:   QT Interval:    QTC Calculation:   R Axis:     Text Interpretation:        Radiology DG Chest 2 View  Result Date: 04/18/2020 CLINICAL DATA:  49 year old male with chest pain. EXAM: CHEST - 2 VIEW COMPARISON:  None. FINDINGS: The lungs are clear. There is no pleural effusion pneumothorax. The cardiac silhouette is within limits. No acute osseous pathology. Pectus excavatum. IMPRESSION: No active cardiopulmonary disease. Electronically Signed   By: Anner Crete M.D.   On: 04/18/2020 17:18    Pertinent labs & imaging results that were available during my care of the patient were reviewed by me and considered in my medical decision making (see chart for details).  Medications Ordered in ED Medications  alum & mag hydroxide-simeth (MAALOX/MYLANTA) 200-200-20 MG/5ML suspension 30 mL (30 mLs Oral Given 04/19/20 0248)    And  lidocaine (  XYLOCAINE) 2 % viscous mouth solution 15 mL (15 mLs Oral Given 04/19/20 0248)                                                                                                                                    Procedures Procedures  (including critical care time)  Medical Decision Making / ED Course I have reviewed the nursing notes for this encounter and the patient's prior records (if available in EHR or on provided paperwork).   Stephen Lozano was evaluated in Emergency Department on 04/19/2020 for the symptoms  described in the history of present illness. He was evaluated in the context of the global COVID-19 pandemic, which necessitated consideration that the patient might be at risk for infection with the SARS-CoV-2 virus that causes COVID-19. Institutional protocols and algorithms that pertain to the evaluation of patients at risk for COVID-19 are in a state of rapid change based on information released by regulatory bodies including the CDC and federal and state organizations. These policies and algorithms were followed during the patient's care in the ED.  Atypical chest pain EKG from UC in the system notable for new TWI in lateral leads when compared to tracing from 2019. Pain has been constant for 4 days.  Serial trops were both negative. Unlikely ACS given the negative trops with duration of pain. Low suspicion for pulmonary embolism.  Presentation not classic for aortic dissection or esophageal perforation. Chest x-ray without evidence suggestive of pneumonia, pneumothorax, pneumomediastinum.  No abnormal contour of the mediastinum to suggest dissection. No evidence of acute injuries.  Likely recurrence of his pericarditis vs GI etiology.  He was given GI cocktail which did provide patient with relief.  Recommended continued treatment with GI meds.  If pain persist then he can take the ibuprofen.  Referral to cardiology for new EKG changes and stress testing.      Final Clinical Impression(s) / ED Diagnoses Final diagnoses:  Precordial pain   The patient appears reasonably screened and/or stabilized for discharge and I doubt any other medical condition or other [facility] requiring further screening, evaluation, or treatment in the ED at this time prior to discharge. Safe for discharge with strict return precautions.  Disposition: Discharge  Condition: Good  I have discussed the results, Dx and Tx plan with the patient/family who expressed understanding and agree(s) with the plan. Discharge  instructions discussed at length. The patient/family was given strict return precautions who verbalized understanding of the instructions. No further questions at time of discharge.    ED Discharge Orders         Ordered    Ambulatory referral to Cardiology        04/19/20 0328           Follow Up: [place] [id number] [phone] Call  To schedule an appointment for close follow up  Primary  care provider  Schedule an appointment as soon as possible for a visit  If you do not have a primary care physician, contact [facility] at [phone] for referral     This chart was dictated using voice recognition software.  Despite best efforts to proofread,  errors can occur which can change the documentation meaning.   Fatima Blank, MD 04/19/20 [phone]

## 2020-05-08 NOTE — Progress Notes (Signed)
Cardiology Office Note:    Date:  05/10/2020   ID:  Stephen Lozano, DOB February 18, 1971, MRN [id number]  PCP:  Patient, No Pcp Per  [place] Cardiologist:  No primary care provider on file.  [facility] Electrophysiologist:  None   Referring MD: Fatima Blank,*    History of Present Illness:    Stephen Lozano is a 49 y.o. 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 Leonette Monarch for evaluation of chest pain.  Patient recently presented to ED on 10/13 with 3 day history of left arm numbness as well as intermittent chest pain. ECG showed NSR with incomplete RBBB, LVH with borderline left axis. Trop 9-->1. He was instructed to follow-up with cardiology for further management.  Patient states that about 2-3 weeks ago, he woke up and was having intermittent chest pain and tingling sensation in left arm. Thought that he may have slept wrong. He went to work and tried to push through but similar episode with leg pain occurred the next day. This prompted him to go to the ED as above.  He states that he is very active and is on his feet all the time (walks 82miles per day, bikes 2 miles per day) and has no exertional symptoms. No chest pain, SOB, lightheadedness, nausea, orthopnea or PND. He had a treadmill stress test in the past which was reportedly normal. Has not had recurrent chest pain since that one episode.  Past Medical History:  Diagnosis Date  . Asthma     Past Surgical History:  Procedure Laterality Date  . APPENDECTOMY    . HERNIA REPAIR    . INGUINAL HERNIA REPAIR Right     Current Medications: Current Meds  Medication Sig  . albuterol (PROVENTIL HFA;VENTOLIN HFA) 108 (90 Base) MCG/ACT inhaler Inhale 2 puffs into the lungs every 6 (six) hours as needed for wheezing or shortness of breath.  . Multiple Vitamins-Minerals  (MULTIVITAMIN WITH MINERALS) tablet Take 1 tablet by mouth daily.     Allergies:   Shellfish allergy   Social History   Socioeconomic History  . Marital status: Single    Spouse name: Not on file  . Number of children: Not on file  . Years of education: Not on file  . Highest education level: Not on file  Occupational History  . Not on file  Tobacco Use  . Smoking status: Never Smoker  . Smokeless tobacco: Never Used  Vaping Use  . Vaping Use: Never used  Substance and Sexual Activity  . Alcohol use: Yes    Comment: occasional  . Drug use: Never  . Sexual activity: Yes  Other Topics Concern  . Not on file  Social History Narrative  . Not on file   Social Determinants of Health   Financial Resource Strain:   . Difficulty of Paying Living Expenses: Not on file  Food Insecurity:   . Worried About Charity fundraiser in the Last Year: Not on file  . Ran Out of Food in the Last Year: Not on file  Transportation Needs:   . Lack of Transportation (Medical): Not on file  . Lack of Transportation (Non-Medical): Not on file  Physical Activity:   . Days of Exercise per Week: Not on file  . Minutes of Exercise per Session: Not on file  Stress:   .  Feeling of Stress : Not on file  Social Connections:   . Frequency of Communication with Friends and Family: Not on file  . Frequency of Social Gatherings with Friends and Family: Not on file  . Attends Religious Services: Not on file  . Active Member of Clubs or Organizations: Not on file  . Attends Archivist Meetings: Not on file  . Marital Status: Not on file     Family History: The patient's family history includes Diabetes in his father and mother.  ROS:   Please see the history of present illness.    Review of Systems  Constitutional: Negative for chills and fever.  HENT: Negative for hearing loss.   Eyes: Negative for blurred vision.  Respiratory: Negative for cough and shortness of breath.    Cardiovascular: Positive for chest pain. Negative for palpitations, orthopnea, claudication, leg swelling and PND.  Gastrointestinal: Positive for heartburn. Negative for nausea and vomiting.  Genitourinary: Negative for flank pain.  Musculoskeletal: Negative for myalgias.  Neurological: Negative for dizziness and loss of consciousness.  Psychiatric/Behavioral: Negative for substance abuse.    EKGs/Labs/Other Studies Reviewed:    The following studies were reviewed today: No prior cardiac study  EKG:  ECG 04/19/20: NSR with RBBB.  Recent Labs: 04/18/2020: BUN 10; Creatinine, Ser 0.93; Hemoglobin 15.0; Platelets 203; Potassium 4.0; Sodium 138  Recent Lipid Panel No results found for: CHOL, TRIG, HDL, CHOLHDL, VLDL, LDLCALC, LDLDIRECT   Risk Assessment/Calculations:       Physical Exam:    VS:  BP (!) 160/72   Pulse 81   Ht 6' (1.829 m)   Wt 219 lb (99.3 kg)   SpO2 96%   BMI 29.70 kg/m     Wt Readings from Last 3 Encounters:  05/10/20 219 lb (99.3 kg)  04/18/20 212 lb (96.2 kg)  02/06/18 213 lb (96.6 kg)     GEN:  Well nourished, well developed in no acute distress HEENT: Normal NECK: No JVD; No carotid bruits LYMPHATICS: No lymphadenopathy CARDIAC: RRR, 2/6 systolic murmur best heard at RUSB, No rubs, gallops RESPIRATORY:  Clear to auscultation without rales, wheezing or rhonchi  ABDOMEN: Soft, non-tender, non-distended MUSCULOSKELETAL:  No edema; No deformity  SKIN: Warm and dry NEUROLOGIC:  Alert and oriented x 3 PSYCHIATRIC:  Normal affect   ASSESSMENT:    1. Murmur, cardiac   2. Chest pain of uncertain etiology   3. Primary hypertension   4. Pericarditis, unspecified chronicity, unspecified type    PLAN:    In order of problems listed above:  #Non cardiac chest pain: Patient had episode of chest pain with arm numbness when he woke up approximately 2 weeks ago. Pain subsided but did not completely go away throughout the day. Woke up the next  morning and continued to have the chest pain as well as left leg numbness which prompted him to go to the ED. There ECG with RBBB but no ischemia. Trops negative. Chest pain abated on it's own. No exertional symptoms and patient remains very active without exercise limitations. Do not suspect cardiac etiology of chest pain given history and reassuring work-up. Will continue to monitor at this time and proceed with risk factor modification. -Check cholesterol panel -Manage HTN as below -Continue management of GERD and recurrent pericarditis -Continue to monitor; if symptoms recur or become associated with exercise, will proceed with stress testing at that time  #Recurrent Pericarditis: Diagnosed in 2016. Has had 3 episodes in 5 years. -Continue NSAIDs as needed  for acute flares -TTE as below  #Systolic murmur: Patient with 2/6 systolic murmur on exam. Given episode of chest pain, uncontrolled HTN and murmur, will check TTE. -Obtain TTE  #HTN: Patient with multiple elevated blood pressures in the ED and here in the office.  -Start amlodipine 5mg  daily -Keep log at home and notify me if the SBP>120s/80s   Medication Adjustments/Labs and Tests Ordered: Current medicines are reviewed at length with the patient today.  Concerns regarding medicines are outlined above.  Orders Placed This Encounter  Procedures  . Lipid panel  . ECHOCARDIOGRAM COMPLETE   Meds ordered this encounter  Medications  . amLODipine (NORVASC) 5 MG tablet    Sig: Take 1 tablet (5 mg total) by mouth daily.    Dispense:  90 tablet    Refill:  3    Patient Instructions  Medication Instructions:  Your physician has recommended you make the following change in your medication:  1-START Amlodipine 5 mg by mouth daily.  *If you need a refill on your cardiac medications before your next appointment, please call your pharmacy*  Lab Work: Your physician recommends that you return for lab work in: 1 week for fasting  lipid panel.  If you have labs (blood work) drawn today and your tests are completely normal, you will receive your results only by: Marland Kitchen MyChart Message (if you have MyChart) OR . A paper copy in the mail If you have any lab test that is abnormal or we need to change your treatment, we will call you to review the results.  Testing/Procedures: Your physician has requested that you have an echocardiogram. Echocardiography is a painless test that uses sound waves to create images of your heart. It provides your doctor with information about the size and shape of your heart and how well your heart's chambers and valves are working. This procedure takes approximately one hour. There are no restrictions for this procedure.  Follow-Up: At [facility], you and your health needs are our priority.  As part of our continuing mission to provide you with exceptional heart care, we have created designated Provider Care Teams.  These Care Teams include your primary Cardiologist (physician) and Advanced Practice Providers (APPs -  Physician Assistants and Nurse Practitioners) who all work together to provide you with the care you need, when you need it.  We recommend signing up for the patient portal called "MyChart".  Sign up information is provided on this After Visit Summary.  MyChart is used to connect with patients for Virtual Visits (Telemedicine).  Patients are able to view lab/test results, encounter notes, upcoming appointments, etc.  Non-urgent messages can be sent to your provider as well.   To learn more about what you can do with MyChart, go to [email or web address].    Your next appointment:   3 month(s)  The format for your next appointment:   In Person  Provider:   You may see Dr. Johney Frame or one of the following Advanced Practice Providers on your designated Care Team:    Richardson Dopp, PA-C  Robbie Lis, [place]        Signed, Freada Bergeron, MD  05/10/2020 4:38 PM    [place]

## 2020-05-10 ENCOUNTER — Ambulatory Visit (INDEPENDENT_AMBULATORY_CARE_PROVIDER_SITE_OTHER): Payer: Self-pay | Admitting: Cardiology

## 2020-05-10 ENCOUNTER — Other Ambulatory Visit: Payer: Self-pay

## 2020-05-10 ENCOUNTER — Encounter: Payer: Self-pay | Admitting: Cardiology

## 2020-05-10 VITALS — BP 160/72 | HR 81 | Ht 72.0 in | Wt 219.0 lb

## 2020-05-10 DIAGNOSIS — I10 Essential (primary) hypertension: Secondary | ICD-10-CM

## 2020-05-10 DIAGNOSIS — R079 Chest pain, unspecified: Secondary | ICD-10-CM

## 2020-05-10 DIAGNOSIS — R011 Cardiac murmur, unspecified: Secondary | ICD-10-CM

## 2020-05-10 DIAGNOSIS — I319 Disease of pericardium, unspecified: Secondary | ICD-10-CM

## 2020-05-10 MED ORDER — AMLODIPINE BESYLATE 5 MG PO TABS: 5.0000 mg | ORAL_TABLET | Freq: Every day | ORAL | 3 refills | Status: DC

## 2020-05-10 NOTE — Patient Instructions (Addendum)
Medication Instructions:  Your physician has recommended you make the following change in your medication:  1-START Amlodipine 5 mg by mouth daily.  *If you need a refill on your cardiac medications before your next appointment, please call your pharmacy*  Lab Work: Your physician recommends that you return for lab work in: 1 week for fasting lipid panel.  If you have labs (blood work) drawn today and your tests are completely normal, you will receive your results only by:  [place] (if you have MyChart) OR  A paper copy in the mail If you have any lab test that is abnormal or we need to change your treatment, we will call you to review the results.  Testing/Procedures: Your physician has requested that you have an echocardiogram. Echocardiography is a painless test that uses sound waves to create images of your heart. It provides your doctor with information about the size and shape of your heart and how well your hearts chambers and valves are working. This procedure takes approximately one hour. There are no restrictions for this procedure.  Follow-Up: At [facility], you and your health needs are our priority.  As part of our continuing mission to provide you with exceptional heart care, we have created designated Provider Care Teams.  These Care Teams include your primary Cardiologist (physician) and Advanced Practice Providers (APPs -  Physician Assistants and Nurse Practitioners) who all work together to provide you with the care you need, when you need it.  We recommend signing up for the patient portal called "MyChart".  Sign up information is provided on this After Visit Summary.  MyChart is used to connect with patients for Virtual Visits (Telemedicine).  Patients are able to view lab/test results, encounter notes, upcoming appointments, etc.  Non-urgent messages can be sent to your provider as well.   To learn more about what you can do with MyChart, go to  [email or web address].    Your next appointment:   3 month(s)  The format for your next appointment:   In Person  Provider:   You may see Dr. Johney Frame or one of the following Advanced Practice Providers on your designated Care Team:    Richardson Dopp, PA-C  [place], [place]

## 2020-05-18 ENCOUNTER — Other Ambulatory Visit: Payer: Self-pay

## 2020-06-05 ENCOUNTER — Other Ambulatory Visit (HOSPITAL_COMMUNITY): Payer: Self-pay

## 2020-06-05 ENCOUNTER — Other Ambulatory Visit: Payer: Self-pay

## 2020-06-27 ENCOUNTER — Other Ambulatory Visit: Payer: Self-pay | Admitting: *Deleted

## 2020-06-27 ENCOUNTER — Ambulatory Visit (HOSPITAL_COMMUNITY): Payer: Self-pay | Attending: Cardiology

## 2020-06-27 ENCOUNTER — Other Ambulatory Visit: Payer: Self-pay

## 2020-06-27 DIAGNOSIS — R011 Cardiac murmur, unspecified: Secondary | ICD-10-CM | POA: Insufficient documentation

## 2020-06-27 LAB — LIPID PANEL
Chol/HDL Ratio: 5.5 ratio — ABNORMAL HIGH (ref 0.0–5.0)
Cholesterol, Total: 220 mg/dL — ABNORMAL HIGH (ref 100–199)
HDL: 40 mg/dL (ref >39)
LDL Chol Calc (NIH): 140 mg/dL — ABNORMAL HIGH (ref 0–99)
Triglycerides: 220 mg/dL — ABNORMAL HIGH (ref 0–149)
VLDL Cholesterol Cal: 40 mg/dL (ref 5–40)

## 2020-06-27 LAB — ECHOCARDIOGRAM COMPLETE
Area-P 1/2: 3.24 cm2
S' Lateral: 2.5 cm

## 2020-08-13 NOTE — Progress Notes (Deleted)
Cardiology Office Note:    Date:  08/13/2020   ID:  Stephen Lozano, DOB 1970/09/20, MRN [id number]  PCP:  Patient, No Pcp Per  [facility] Cardiologist:  No primary care provider on file.  [facility] Electrophysiologist:  None   Referring MD: No ref. provider found     History of Present Illness:    Stephen Lozano is a 50 y.o. 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  Patient presented to ED on 04/18/20 with 3 day history of left arm numbness as well as intermittent chest pain. ECG showed NSR with incomplete RBBB, LVH with borderline left axis. Trop 9-->1. He was discharged home and seen by me on 05/10/20 where he was doing well. No recurrence of symptoms. Remained very active without limitations. Given his history of recurrent pericarditis, we recommended TTE which showed normal BiV function, no effusion, no significant valvular abnormalities.  Past Medical History:  Diagnosis Date  . Asthma     Past Surgical History:  Procedure Laterality Date  . APPENDECTOMY    . HERNIA REPAIR    . INGUINAL HERNIA REPAIR Right     Current Medications: No outpatient medications have been marked as taking for the 08/16/20 encounter (Appointment) with Freada Bergeron, MD.     Allergies:   Shellfish allergy   Social History   Socioeconomic History  . Marital status: Single    Spouse name: Not on file  . Number of children: Not on file  . Years of education: Not on file  . Highest education level: Not on file  Occupational History  . Not on file  Tobacco Use  . Smoking status: Never Smoker  . Smokeless tobacco: Never Used  Vaping Use  . Vaping Use: Never used  Substance and Sexual Activity  . Alcohol use: Yes    Comment: occasional  . Drug use: Never  . Sexual activity: Yes  Other Topics Concern  . Not on file   Social History Narrative  . Not on file   Social Determinants of Health   Financial Resource Strain: Not on file  Food Insecurity: Not on file  Transportation Needs: Not on file  Physical Activity: Not on file  Stress: Not on file  Social Connections: Not on file     Family History: The patient's ***family history includes Diabetes in his father and mother.  ROS:   Please see the history of present illness.    *** All other systems reviewed and are negative.  EKGs/Labs/Other Studies Reviewed:    The following studies were reviewed today: 1. Left ventricular ejection fraction, by estimation, is 60 to 65%. The  left ventricle has normal function.  2. The left ventricle has no regional wall motion abnormalities.  3. There is mild concentric left ventricular hypertrophy.  4. Left ventricular diastolic parameters were normal.  5. Right ventricular systolic function is normal. The right ventricular  size is normal. There is normal pulmonary artery systolic pressure.  6. The mitral valve is normal in structure. Trivial mitral valve  regurgitation. No evidence of mitral stenosis.  7. The aortic valve is tricuspid. Aortic valve regurgitation is not  visualized.  8. The inferior vena cava is normal in size with greater than 50%  respiratory variability, suggesting right atrial pressure of 3 mmHg.  EKG:  EKG is *** ordered  today.  The ekg ordered today demonstrates ***  Recent Labs: 04/18/2020: BUN 10; Creatinine, Ser 0.93; Hemoglobin 15.0; Platelets 203; Potassium 4.0; Sodium 138  Recent Lipid Panel    Component Value Date/Time   CHOL 220 (H) 06/27/2020 0830   TRIG 220 (H) 06/27/2020 0830   HDL 40 06/27/2020 0830   CHOLHDL 5.5 (H) 06/27/2020 0830   LDLCALC 140 (H) 06/27/2020 0830     Risk Assessment/Calculations:   {Does this patient have ATRIAL FIBRILLATION?:[phone]}   Physical Exam:    VS:  There were no vitals taken for this visit.    Wt Readings from  Last 3 Encounters:  05/10/20 219 lb (99.3 kg)  04/18/20 212 lb (96.2 kg)  02/06/18 213 lb (96.6 kg)     GEN: *** Well nourished, well developed in no acute distress HEENT: Normal NECK: No JVD; No carotid bruits LYMPHATICS: No lymphadenopathy CARDIAC: ***RRR, no murmurs, rubs, gallops RESPIRATORY:  Clear to auscultation without rales, wheezing or rhonchi  ABDOMEN: Soft, non-tender, non-distended MUSCULOSKELETAL:  No edema; No deformity  SKIN: Warm and dry NEUROLOGIC:  Alert and oriented x 3 PSYCHIATRIC:  Normal affect   ASSESSMENT:    No diagnosis found. PLAN:    In order of problems listed above:  #Non cardiac chest pain: Patient had episode of chest pain with arm numbness when he woke up approximately 2 weeks ago. Pain subsided but did not completely go away throughout the day. Woke up the next morning and continued to have the chest pain as well as left leg numbness which prompted him to go to the ED. There ECG with RBBB but no ischemia. Trops negative. Chest pain abated on it's own. No exertional symptoms and patient remains very active without exercise limitations. Do not suspect cardiac etiology of chest pain given history and reassuring work-up. Will continue to monitor at this time and proceed with risk factor modification. -Manage HTN as below -Continue management of GERD and recurrent pericarditis -Continue to monitor; if symptoms recur or become associated with exercise, will proceed with stress testing at that time  #Recurrent Pericarditis: Diagnosed in 2016. Has had 3 episodes in 5 years. -Continue NSAIDs as needed for acute flares -TTE with normal BiV function, no significant effusion   #HTN: -Continue amlodipine 5mg  daily -Keep log at home and notify me if the SBP>120s/80s   {Are you ordering a CV Procedure (e.g. stress test, cath, DCCV, TEE, etc)?   Press F2        :[id number]}    Medication Adjustments/Labs and Tests Ordered: Current medicines are  reviewed at length with the patient today.  Concerns regarding medicines are outlined above.  No orders of the defined types were placed in this encounter.  No orders of the defined types were placed in this encounter.   There are no Patient Instructions on file for this visit.   Signed, Freada Bergeron, MD  08/13/2020 1:26 PM    [facility]

## 2020-08-16 ENCOUNTER — Ambulatory Visit: Payer: Self-pay | Admitting: Cardiology

## 2020-09-29 ENCOUNTER — Other Ambulatory Visit: Payer: Self-pay

## 2020-09-29 ENCOUNTER — Ambulatory Visit (HOSPITAL_COMMUNITY)
Admission: EM | Admit: 2020-09-29 | Discharge: 2020-09-29 | Disposition: A | Discharge status: Home / Self Care | Payer: Self-pay | Attending: Student | Admitting: Student

## 2020-09-29 ENCOUNTER — Encounter (HOSPITAL_COMMUNITY): Payer: Self-pay

## 2020-09-29 DIAGNOSIS — J4520 Mild intermittent asthma, uncomplicated: Secondary | ICD-10-CM

## 2020-09-29 DIAGNOSIS — J029 Acute pharyngitis, unspecified: Secondary | ICD-10-CM

## 2020-09-29 DIAGNOSIS — H6691 Otitis media, unspecified, right ear: Secondary | ICD-10-CM

## 2020-09-29 MED ORDER — AMOXICILLIN-POT CLAVULANATE 875-125 MG PO TABS: 1.0000 | ORAL_TABLET | Freq: Two times a day (BID) | ORAL | 0 refills | Status: DC

## 2020-09-29 NOTE — ED Provider Notes (Signed)
[facility]    CSN: [id number] Arrival date & time: 09/29/20  1420      History   Chief Complaint Chief Complaint  Patient presents with  . Ear Pain  . Sore Throat    HPI Stephen Lozano is a 50 y.o. male presenting with sore throat and right ear pain. History asthma, currently well controlled on occasional albuterol. Denies fevers/chills, n/v/d, shortness of breath, chest pain, cough, congestion, facial pain, teeth pain, headaches, loss of taste/smell, swollen lymph nodes, trouble swallowing, tinnitus, dizziness, hearing changes. Denies seasonal allergic rhinitis.   HPI  Past Medical History:  Diagnosis Date  . Asthma     There are no problems to display for this patient.   Past Surgical History:  Procedure Laterality Date  . APPENDECTOMY    . HERNIA REPAIR    . INGUINAL HERNIA REPAIR Right        Home Medications    Prior to Admission medications   Medication Sig Start Date End Date Taking? Authorizing Provider  amoxicillin-clavulanate (AUGMENTIN) 875-125 MG tablet Take 1 tablet by mouth every 12 (twelve) hours. 09/29/20  Yes Hazel Sams, PA-C  albuterol (PROVENTIL HFA;VENTOLIN HFA) 108 (90 Base) MCG/ACT inhaler Inhale 2 puffs into the lungs every 6 (six) hours as needed for wheezing or shortness of breath.    [provider]  amLODipine (NORVASC) 5 MG tablet Take 1 tablet (5 mg total) by mouth daily. 05/10/20   Freada Bergeron, MD  Multiple Vitamins-Minerals (MULTIVITAMIN WITH MINERALS) tablet Take 1 tablet by mouth daily.    [provider]    Family History Family History  Problem Relation Age of Onset  . Diabetes Mother   . Diabetes Father     Social History Social History   Tobacco Use  . Smoking status: Never Smoker  . Smokeless tobacco: Never Used  Vaping Use  . Vaping Use: Never used  Substance Use Topics  . Alcohol use: Yes    Comment: occasional  . Drug use: Never     Allergies   Shellfish  allergy   Review of Systems Review of Systems  Constitutional: Negative for appetite change, chills and fever.  HENT: Positive for ear pain and sore throat. Negative for congestion, ear discharge, rhinorrhea, sinus pressure and sinus pain.   Eyes: Negative for redness and visual disturbance.  Respiratory: Negative for cough, chest tightness, shortness of breath and wheezing.   Cardiovascular: Negative for chest pain and palpitations.  Gastrointestinal: Negative for abdominal pain, constipation, diarrhea, nausea and vomiting.  Genitourinary: Negative for dysuria, frequency and urgency.  Musculoskeletal: Negative for myalgias.  Neurological: Negative for dizziness, weakness and headaches.  Psychiatric/Behavioral: Negative for confusion.  All other systems reviewed and are negative.    Physical Exam Triage Vital Signs ED Triage Vitals  Enc Vitals Group     BP 09/29/20 1453 (!) 166/89     Pulse Rate 09/29/20 1445 68     Resp 09/29/20 1445 18     Temp 09/29/20 1445 98.3 F (36.8 C)     Temp Source 09/29/20 1445 Oral     SpO2 09/29/20 1445 98 %     Weight --      Height --      Head Circumference --      Peak Flow --      Pain Score 09/29/20 1444 6     Pain Loc --      Pain Edu? --      Excl. in  GC? --    No data found.  Updated Vital Signs BP (!) 166/89   Pulse 68   Temp 98.3 F (36.8 C) (Oral)   Resp 18   SpO2 98%   Visual Acuity Right Eye Distance:   Left Eye Distance:   Bilateral Distance:    Right Eye Near:   Left Eye Near:    Bilateral Near:     Physical Exam Vitals reviewed.  Constitutional:      General: He is not in acute distress.    Appearance: Normal appearance. He is not ill-appearing.  HENT:     Head: Normocephalic and atraumatic.     Right Ear: Hearing and ear canal normal. No swelling or tenderness. There is no impacted cerumen. There is mastoid tenderness. Tympanic membrane is erythematous. Tympanic membrane is not perforated, retracted or  bulging.     Left Ear: Hearing, tympanic membrane, ear canal and external ear normal. No swelling or tenderness. There is no impacted cerumen. No mastoid tenderness. Tympanic membrane is not perforated, erythematous, retracted or bulging.     Nose:     Right Sinus: No maxillary sinus tenderness or frontal sinus tenderness.     Left Sinus: No maxillary sinus tenderness or frontal sinus tenderness.     Mouth/Throat:     Mouth: Mucous membranes are moist.     Pharynx: Uvula midline. Posterior oropharyngeal erythema present. No oropharyngeal exudate.     Tonsils: No tonsillar exudate. 1+ on the right. 1+ on the left.     Comments: Smooth erythema posterior pharynx Cardiovascular:     Rate and Rhythm: Normal rate and regular rhythm.     Heart sounds: Normal heart sounds.  Pulmonary:     Breath sounds: Normal breath sounds and air entry. No wheezing, rhonchi or rales.  Chest:     Chest wall: No tenderness.  Abdominal:     General: Abdomen is flat. Bowel sounds are normal.     Tenderness: There is no abdominal tenderness. There is no guarding or rebound.  Lymphadenopathy:     Cervical: No cervical adenopathy.  Neurological:     General: No focal deficit present.     Mental Status: He is alert and oriented to person, place, and time.  Psychiatric:        Attention and Perception: Attention and perception normal.        Mood and Affect: Mood and affect normal.        Behavior: Behavior normal. Behavior is cooperative.        Thought Content: Thought content normal.        Judgment: Judgment normal.      UC Treatments / Results  Labs (all labs ordered are listed, but only abnormal results are displayed) Labs Reviewed - No data to display  EKG   Radiology No results found.  Procedures Procedures (including critical care time)  Medications Ordered in UC Medications - No data to display  Initial Impression / Assessment and Plan / UC Course  I have reviewed the triage vital  signs and the nursing notes.  Pertinent labs & imaging results that were available during my care of the patient were reviewed by me and considered in my medical decision making (see chart for details).     This patient is a 50 year old male presenting with AOM and viral tonsillitis. Today this pt is afebrile nontachycardic nontachypneic, oxygenating well on room air, no wheezes rhonchi or rales.   Plan to treat with augmentin as  below. Asthma- continue prn albuterol Centor score 1, rapid strep deferred, particularly as we are already treating with augmentin for AOM. covid declined.   Return precautions discussed.  This chart was dictated using voice recognition software, Dragon. Despite the best efforts of this provider to proofread and correct errors, errors may still occur which can change documentation meaning.  Final Clinical Impressions(s) / UC Diagnoses   Final diagnoses:  Acute otitis media, right  Acute pharyngitis, unspecified etiology  Mild intermittent asthma without complication     Discharge Instructions     -Start the antibiotic-Augmentin (amoxicillin-clavulanate), 1 pill every 12 hours.  You can take this with food like with breakfast and dinner. -For further relief of pain- Take Tylenol 1000 mg up to 3 times daily, and ibuprofen 800 mg up to 3 times daily with food.  You can take these together, or alternate every 3-4 hours. -If your symptoms get worse instead of better despite treatment, seek additional medical attention.  This includes worsening of ear pain, hearing changes, dizziness, shortness of breath, trouble swallowing, chest pain.     ED Prescriptions    Medication Sig Dispense Auth. Provider   amoxicillin-clavulanate (AUGMENTIN) 875-125 MG tablet Take 1 tablet by mouth every 12 (twelve) hours. 14 tablet Hazel Sams, PA-C     PDMP not reviewed this encounter.   Hazel Sams, PA-C 09/29/20 1547

## 2020-09-29 NOTE — Discharge Instructions (Addendum)
-  Start the antibiotic-Augmentin (amoxicillin-clavulanate), 1 pill every 12 hours.  You can take this with food like with breakfast and dinner. -For further relief of pain- Take Tylenol 1000 mg up to 3 times daily, and ibuprofen 800 mg up to 3 times daily with food.  You can take these together, or alternate every 3-4 hours. -If your symptoms get worse instead of better despite treatment, seek additional medical attention.  This includes worsening of ear pain, hearing changes, dizziness, shortness of breath, trouble swallowing, chest pain.

## 2020-09-29 NOTE — ED Triage Notes (Signed)
Pt c/o sore throat and painful swallowing X 3 days. Pt states he has right ear pain. Pt denies fever.

## 2021-02-19 ENCOUNTER — Encounter (HOSPITAL_COMMUNITY): Payer: Self-pay

## 2021-02-19 ENCOUNTER — Emergency Department (HOSPITAL_COMMUNITY)
Admission: EM | Admit: 2021-02-19 | Discharge: 2021-02-19 | Disposition: A | Discharge status: Home / Self Care | Payer: Self-pay | Attending: Emergency Medicine | Admitting: Emergency Medicine

## 2021-02-19 ENCOUNTER — Emergency Department (HOSPITAL_COMMUNITY): Payer: Self-pay

## 2021-02-19 ENCOUNTER — Other Ambulatory Visit: Payer: Self-pay

## 2021-02-19 DIAGNOSIS — J45909 Unspecified asthma, uncomplicated: Secondary | ICD-10-CM | POA: Insufficient documentation

## 2021-02-19 DIAGNOSIS — R072 Precordial pain: Secondary | ICD-10-CM | POA: Diagnosis not present

## 2021-02-19 DIAGNOSIS — I10 Essential (primary) hypertension: Secondary | ICD-10-CM | POA: Insufficient documentation

## 2021-02-19 DIAGNOSIS — Z79899 Other long term (current) drug therapy: Secondary | ICD-10-CM | POA: Insufficient documentation

## 2021-02-19 DIAGNOSIS — R079 Chest pain, unspecified: Secondary | ICD-10-CM

## 2021-02-19 LAB — COMPREHENSIVE METABOLIC PANEL
ALT: 211 U/L — ABNORMAL HIGH (ref 0–44)
AST: 300 U/L — ABNORMAL HIGH (ref 15–41)
Albumin: 4.2 g/dL (ref 3.5–5.0)
Alkaline Phosphatase: 59 U/L (ref 38–126)
Anion gap: 9 (ref 5–15)
BUN: 11 mg/dL (ref 6–20)
CO2: 27 mmol/L (ref 22–32)
Calcium: 9.9 mg/dL (ref 8.9–10.3)
Chloride: 103 mmol/L (ref 98–111)
Creatinine, Ser: 1.14 mg/dL (ref 0.61–1.24)
GFR, Estimated: >60 mL/min (ref >60)
Glucose, Bld: 118 mg/dL — ABNORMAL HIGH (ref 70–99)
Potassium: 4.2 mmol/L (ref 3.5–5.1)
Sodium: 139 mmol/L (ref 135–145)
Total Bilirubin: 1.6 mg/dL — ABNORMAL HIGH (ref 0.3–1.2)
Total Protein: 7.6 g/dL (ref 6.5–8.1)

## 2021-02-19 LAB — CBC WITH DIFFERENTIAL/PLATELET
Abs Immature Granulocytes: 0.01 10*3/uL (ref 0.00–0.07)
Basophils Absolute: 0 10*3/uL (ref 0.0–0.1)
Basophils Relative: 1 %
Eosinophils Absolute: 0.1 10*3/uL (ref 0.0–0.5)
Eosinophils Relative: 2 %
HCT: 43.3 % (ref 39.0–52.0)
Hemoglobin: 15.2 g/dL (ref 13.0–17.0)
Immature Granulocytes: 0 %
Lymphocytes Relative: 28 %
Lymphs Abs: 1.7 10*3/uL (ref 0.7–4.0)
MCH: 32.9 pg (ref 26.0–34.0)
MCHC: 35.1 g/dL (ref 30.0–36.0)
MCV: 93.7 fL (ref 80.0–100.0)
Monocytes Absolute: 0.6 10*3/uL (ref 0.1–1.0)
Monocytes Relative: 10 %
Neutro Abs: 3.6 10*3/uL (ref 1.7–7.7)
Neutrophils Relative %: 59 %
Platelets: 188 10*3/uL (ref 150–400)
RBC: 4.62 MIL/uL (ref 4.22–5.81)
RDW: 11.9 % (ref 11.5–15.5)
WBC: 6.2 10*3/uL (ref 4.0–10.5)
nRBC: 0 % (ref 0.0–0.2)

## 2021-02-19 LAB — TROPONIN I (HIGH SENSITIVITY)
Troponin I (High Sensitivity): 14 ng/L (ref <18)
Troponin I (High Sensitivity): 14 ng/L (ref <18)

## 2021-02-19 MED ORDER — PANTOPRAZOLE SODIUM 20 MG PO TBEC: 20.0000 mg | DELAYED_RELEASE_TABLET | Freq: Every day | ORAL | 0 refills | Status: DC

## 2021-02-19 NOTE — Discharge Instructions (Addendum)
Overall work-up today was unremarkable.  Your liver enzymes are mildly elevated as discussed.  Continue to have these followed by your primary care doctor.  Please return if you develop any worsening chest pain or abdominal pain as discussed.  Take Protonix as this could be some reflux type symptoms that you are having.  Would recommend Tylenol and ibuprofen intermittently as well for pain.

## 2021-02-19 NOTE — ED Provider Notes (Signed)
[facility]PART[facility] Provider Note   CSN: [id number] Arrival date & time: 02/19/21  1212     History Chief Complaint  Patient presents with   Chest Pain    Stephen Lozano is a 50 y.o. male.  The history is provided by the patient.  Chest Pain Pain location:  Substernal area Pain quality: aching   Pain radiates to:  Does not radiate Pain severity:  Mild Progression:  Resolved Chronicity:  Recurrent Context: at rest   Relieved by:  Nothing Worsened by:  Nothing Associated symptoms: no abdominal pain, no anorexia, no anxiety, no back pain, no cough, no fever, no nausea, no palpitations, no shortness of breath, no vomiting and no weakness   Risk factors: no coronary artery disease, no high cholesterol, no hypertension and no prior DVT/PE       Past Medical History:  Diagnosis Date   Asthma     There are no problems to display for this patient.   Past Surgical History:  Procedure Laterality Date   APPENDECTOMY     HERNIA REPAIR     INGUINAL HERNIA REPAIR Right        Family History  Problem Relation Age of Onset   Diabetes Mother    Diabetes Father     Social History   Tobacco Use   Smoking status: Never   Smokeless tobacco: Never  Vaping Use   Vaping Use: Never used  Substance Use Topics   Alcohol use: Yes    Comment: occasional   Drug use: Never    Home Medications Prior to Admission medications   Medication Sig Start Date End Date Taking? Authorizing Provider  pantoprazole (PROTONIX) 20 MG tablet Take 1 tablet (20 mg total) by mouth daily for 14 days. 02/19/21 03/05/21 Yes Numa Heatwole, DO  albuterol (PROVENTIL HFA;VENTOLIN HFA) 108 (90 Base) MCG/ACT inhaler Inhale 2 puffs into the lungs every 6 (six) hours as needed for wheezing or shortness of breath.    [provider]  amLODipine (NORVASC) 5 MG tablet Take 1 tablet (5 mg total) by mouth daily. 05/10/20   Freada Bergeron, MD   amoxicillin-clavulanate (AUGMENTIN) 875-125 MG tablet Take 1 tablet by mouth every 12 (twelve) hours. 09/29/20   Hazel Sams, PA-C  Multiple Vitamins-Minerals (MULTIVITAMIN WITH MINERALS) tablet Take 1 tablet by mouth daily.    [provider]    Allergies    Shellfish allergy  Review of Systems   Review of Systems  Constitutional:  Negative for chills and fever.  HENT:  Negative for ear pain and sore throat.   Eyes:  Negative for pain and visual disturbance.  Respiratory:  Negative for cough and shortness of breath.   Cardiovascular:  Positive for chest pain. Negative for palpitations.  Gastrointestinal:  Negative for abdominal pain, anorexia, nausea and vomiting.  Genitourinary:  Negative for dysuria and hematuria.  Musculoskeletal:  Negative for arthralgias and back pain.  Skin:  Negative for color change and rash.  Neurological:  Negative for seizures, syncope and weakness.  All other systems reviewed and are negative.  Physical Exam Updated Vital Signs BP (!) 161/92 (BP Location: Right Arm)   Pulse 78   Temp 98.2 F (36.8 C) (Oral)   Resp 16   Ht 6' (1.829 m)   Wt 96.2 kg   SpO2 96%   BMI 28.75 kg/m   Physical Exam Vitals and nursing note reviewed.  Constitutional:      Appearance: He is well-developed.  HENT:     Head: Normocephalic and atraumatic.  Eyes:     Extraocular Movements: Extraocular movements intact.     Conjunctiva/sclera: Conjunctivae normal.     Pupils: Pupils are equal, round, and reactive to light.  Cardiovascular:     Rate and Rhythm: Normal rate and regular rhythm.     Pulses:          Radial pulses are 2+ on the right side and 2+ on the left side.     Heart sounds: Normal heart sounds. No murmur heard. Pulmonary:     Effort: Pulmonary effort is normal. No respiratory distress.     Breath sounds: Normal breath sounds. No decreased breath sounds or wheezing.  Abdominal:     Palpations: Abdomen is soft.     Tenderness: There is  no abdominal tenderness.  Musculoskeletal:        General: Normal range of motion.     Cervical back: Normal range of motion and neck supple.  Skin:    General: Skin is warm and dry.     Capillary Refill: Capillary refill takes less than 2 seconds.  Neurological:     General: No focal deficit present.     Mental Status: He is alert.    ED Results / Procedures / Treatments   Labs (all labs ordered are listed, but only abnormal results are displayed) Labs Reviewed  COMPREHENSIVE METABOLIC PANEL - Abnormal; Notable for the following components:      Result Value   Glucose, Bld 118 (*)    AST 300 (*)    ALT 211 (*)    Total Bilirubin 1.6 (*)    All other components within normal limits  CBC WITH DIFFERENTIAL/PLATELET  TROPONIN I (HIGH SENSITIVITY)  TROPONIN I (HIGH SENSITIVITY)    EKG None  Radiology DG Chest 1 View  Result Date: 02/19/2021 CLINICAL DATA:  Left chest pain EXAM: CHEST  1 VIEW COMPARISON:  Chest x-ray dated April 18, 2020 FINDINGS: The heart size and mediastinal contours are within normal limits. Both lungs are clear. Unchanged mild biapical pleural thickening. The visualized skeletal structures are unremarkable. IMPRESSION: No active disease. Electronically Signed   By: Yetta Glassman M.D.   On: 02/19/2021 13:18    Procedures Procedures   Medications Ordered in ED Medications - No data to display  ED Course  I have reviewed the triage vital signs and the nursing notes.  Pertinent labs & imaging results that were available during my care of the patient were reviewed by me and considered in my medical decision making (see chart for details).    MDM Rules/Calculators/A&P                           Stephen Lozano is here with chest pain.  No significant medical history.  EKG shows sinus rhythm.  No ischemic changes.  Unchanged from prior EKGs.  Troponin negative x2.  Overall atypical sounding chest pain.  History of pericarditis in the past but do not  see evidence of that on EKG.  No active chest pain now.  No significant anemia or electrolyte abnormality.  No abdominal pain.  Liver enzymes are mildly elevated but have been in the past.  Recommend to continue to follow with primary care doctor about these.  He is not having any epigastric abdominal pain or right upper quadrant tenderness.  No concern for pancreatitis or cholecystitis.  No fever no white count.  Chest x-ray  showed no evidence of pneumonia or volume overload.  Overall patient appears well.  Could be reflux related pain or MSK related pain.  We will trial him on Protonix.  Recommend follow-up with his cardiologist if symptoms are persisting.  Understands return precautions.  Discharged in the ED in good condition.  This chart was dictated using voice recognition software.  Despite best efforts to proofread,  errors can occur which can change the documentation meaning.   Final Clinical Impression(s) / ED Diagnoses Final diagnoses:  Nonspecific chest pain    Rx / DC Orders ED Discharge Orders          Ordered    pantoprazole (PROTONIX) 20 MG tablet  Daily        02/19/21 1858             Lennice Sites, DO 02/19/21 1900

## 2021-02-19 NOTE — ED Provider Notes (Signed)
Emergency Medicine Provider Triage Evaluation Note  Stephen Lozano , a 50 y.o. male  was evaluated in triage.  Pt complains of chest pain.  Pt reports he has restless heart syndrome.  Pt reports more pain than usual  Review of Systems  Positive: Chest discomfort Negative:  No cough  Physical Exam  BP (!) 163/95   Pulse 78   Temp 98.2 F (36.8 C) (Oral)   Resp 17   Ht 6' (1.829 m)   Wt 96.2 kg   SpO2 98%   BMI 28.75 kg/m  Gen:   Awake, no distress   Resp:  Normal effort  MSK:   Moves extremities without difficulty  Other:    Medical Decision Making  Medically screening exam initiated at 12:31 PM.  Appropriate orders placed.  Stephen Lozano was informed that the remainder of the evaluation will be completed by another provider, this initial triage assessment does not replace that evaluation, and the importance of remaining in the ED until their evaluation is complete.     Fransico Meadow, Hershal Coria 02/19/21 1232    Blanchie Dessert, MD 02/19/21 [phone]

## 2021-02-19 NOTE — ED Notes (Signed)
Received verbal report from Beattie at this time

## 2021-02-19 NOTE — ED Triage Notes (Signed)
Pt reports left sided/cental chest pain that started yesterday, denies any other symptoms. Pt a.o, resp e.u

## 2021-10-31 ENCOUNTER — Encounter (HOSPITAL_COMMUNITY): Payer: Self-pay

## 2021-10-31 ENCOUNTER — Ambulatory Visit (HOSPITAL_COMMUNITY)
Admission: EM | Admit: 2021-10-31 | Discharge: 2021-10-31 | Disposition: A | Discharge status: Home / Self Care | Payer: BC Managed Care – PPO | Attending: Family Medicine | Admitting: Family Medicine

## 2021-10-31 DIAGNOSIS — H6982 Other specified disorders of Eustachian tube, left ear: Secondary | ICD-10-CM

## 2021-10-31 MED ORDER — PREDNISONE 20 MG PO TABS: 40.0000 mg | ORAL_TABLET | Freq: Every day | ORAL | 0 refills | Status: AC

## 2021-10-31 NOTE — ED Triage Notes (Signed)
3 day h/o ringing sensation in left ear that progressed to a throbbing pain as of yesterday. No meds taken. No decrease in hearing. ?

## 2021-10-31 NOTE — Discharge Instructions (Addendum)
You were seen today for left ear pain.  Your exam does not show infection, but rather fluid behind the ear.  I have sent out prednisone to take daily for the fluid.  I also recommend over the counter zyrtec or claritin to help with your symptoms.  You may take motrin/ibuprofen for pain.  ?Please return if your symptoms do not improve or worsen over the next several days.  ?

## 2021-10-31 NOTE — ED Provider Notes (Signed)
?[facility] ? ? ? ?CSN: [id number] ?Arrival date & time: 10/31/21  2542 ? ? ?  ? ?History   ?Chief Complaint ?Chief Complaint  ?Patient presents with  ? Otalgia  ?  left  ? ? ?HPI ?Stephen Lozano is a 51 y.o. male.  ? ?Patient is here for left ear pain since yesterday.  Started as a ringing 3 days ago.  ?No issues with the right ear.  ?No uri symptoms noted.  ?Has happened in the past.  ? ?Past Medical History:  ?Diagnosis Date  ? Asthma   ? ? ?There are no problems to display for this patient. ? ? ?Past Surgical History:  ?Procedure Laterality Date  ? APPENDECTOMY    ? HERNIA REPAIR    ? INGUINAL HERNIA REPAIR Right   ? ? ? ? ? ?Home Medications   ? ?Prior to Admission medications   ?Medication Sig Start Date End Date Taking? Authorizing Provider  ?albuterol (PROVENTIL HFA;VENTOLIN HFA) 108 (90 Base) MCG/ACT inhaler Inhale 2 puffs into the lungs every 6 (six) hours as needed for wheezing or shortness of breath.    [provider]  ?amLODipine (NORVASC) 5 MG tablet Take 1 tablet (5 mg total) by mouth daily. 05/10/20   Freada Bergeron, MD  ?amoxicillin-clavulanate (AUGMENTIN) 875-125 MG tablet Take 1 tablet by mouth every 12 (twelve) hours. 09/29/20   Hazel Sams, PA-C  ?Multiple Vitamins-Minerals (MULTIVITAMIN WITH MINERALS) tablet Take 1 tablet by mouth daily.    [provider]  ?pantoprazole (PROTONIX) 20 MG tablet Take 1 tablet (20 mg total) by mouth daily for 14 days. 02/19/21 03/05/21  Lennice Sites, DO  ? ? ?Family History ?Family History  ?Problem Relation Age of Onset  ? Diabetes Mother   ? Diabetes Father   ? ? ?Social History ?Social History  ? ?Tobacco Use  ? Smoking status: Never  ? Smokeless tobacco: Never  ?Vaping Use  ? Vaping Use: Never used  ?Substance Use Topics  ? Alcohol use: Yes  ?  Comment: occasional  ? Drug use: Never  ? ? ? ?Allergies   ?Shellfish allergy ? ? ?Review of Systems ?Review of Systems  ?Constitutional: Negative.   ?HENT:  Positive for ear  pain.   ?Respiratory: Negative.    ?Cardiovascular: Negative.   ?Gastrointestinal: Negative.   ?Genitourinary: Negative.   ? ? ?Physical Exam ?Triage Vital Signs ?ED Triage Vitals [10/31/21 1034]  ?Enc Vitals Group  ?   BP (!) 166/95  ?   Pulse Rate 70  ?   Resp 18  ?   Temp 97.8 ?F (36.6 ?C)  ?   Temp Source Oral  ?   SpO2 96 %  ?   Weight   ?   Height   ?   Head Circumference   ?   Peak Flow   ?   Pain Score 6  ?   Pain Loc   ?   Pain Edu?   ?   Excl. in [place]?   ? ?No data found. ? ?Updated Vital Signs ?BP (!) 166/95 (BP Location: Left Arm)   Pulse 70   Temp 97.8 ?F (36.6 ?C) (Oral)   Resp 18   SpO2 96%  ? ?Visual Acuity ?Right Eye Distance:   ?Left Eye Distance:   ?Bilateral Distance:   ? ?Right Eye Near:   ?Left Eye Near:    ?Bilateral Near:    ? ?Physical Exam ?Constitutional:   ?   Appearance: Normal  appearance.  ?HENT:  ?   Left Ear: Hearing normal. A middle ear effusion is present. Tympanic membrane is not erythematous or bulging.  ?   Nose: Nose normal.  ?   Mouth/Throat:  ?   Mouth: Mucous membranes are moist.  ?   Pharynx: No oropharyngeal exudate or posterior oropharyngeal erythema.  ?Eyes:  ?   Pupils: Pupils are equal, round, and reactive to light.  ?Cardiovascular:  ?   Rate and Rhythm: Normal rate and regular rhythm.  ?Pulmonary:  ?   Effort: Pulmonary effort is normal.  ?   Breath sounds: Normal breath sounds.  ?Musculoskeletal:  ?   Cervical back: Neck supple. No tenderness.  ?Neurological:  ?   Mental Status: He is alert.  ? ? ? ?UC Treatments / Results  ?Labs ?(all labs ordered are listed, but only abnormal results are displayed) ?Labs Reviewed - No data to display ? ?EKG ? ? ?Radiology ?No results found. ? ?Procedures ?Procedures (including critical care time) ? ?Medications Ordered in UC ?Medications - No data to display ? ?Initial Impression / Assessment and Plan / UC Course  ?I have reviewed the triage vital signs and the nursing notes. ? ?Pertinent labs & imaging results that were  available during my care of the patient were reviewed by me and considered in my medical decision making (see chart for details). ? ? ?Final Clinical Impressions(s) / UC Diagnoses  ? ?Final diagnoses:  ?Dysfunction of left eustachian tube  ? ? ? ?Discharge Instructions   ? ?  ?You were seen today for left ear pain.  Your exam does not show infection, but rather fluid behind the ear.  I have sent out prednisone to take daily for the fluid.  I also recommend over the counter zyrtec or claritin to help with your symptoms.  You may take motrin/ibuprofen for pain.  ?Please return if your symptoms do not improve or worsen over the next several days.  ? ? ? ?ED Prescriptions   ? ? Medication Sig Dispense Auth. Provider  ? predniSONE (DELTASONE) 20 MG tablet Take 2 tablets (40 mg total) by mouth daily for 5 days. 10 tablet Rondel Oh, MD  ? ?  ? ?PDMP not reviewed this encounter. ?  ?Rondel Oh, MD ?10/31/21 1120 ? ?

## 2021-11-14 ENCOUNTER — Other Ambulatory Visit: Payer: Self-pay

## 2021-11-14 ENCOUNTER — Ambulatory Visit (HOSPITAL_COMMUNITY): Payer: BC Managed Care – PPO

## 2021-11-14 ENCOUNTER — Emergency Department (HOSPITAL_COMMUNITY): Payer: BC Managed Care – PPO

## 2021-11-14 ENCOUNTER — Emergency Department (HOSPITAL_COMMUNITY)
Admission: EM | Admit: 2021-11-14 | Discharge: 2021-11-14 | Disposition: A | Discharge status: Home / Self Care | Payer: BC Managed Care – PPO | Attending: Emergency Medicine | Admitting: Emergency Medicine

## 2021-11-14 ENCOUNTER — Encounter (HOSPITAL_COMMUNITY): Payer: Self-pay

## 2021-11-14 DIAGNOSIS — J45909 Unspecified asthma, uncomplicated: Secondary | ICD-10-CM | POA: Insufficient documentation

## 2021-11-14 DIAGNOSIS — I10 Essential (primary) hypertension: Secondary | ICD-10-CM | POA: Diagnosis not present

## 2021-11-14 DIAGNOSIS — R030 Elevated blood-pressure reading, without diagnosis of hypertension: Secondary | ICD-10-CM | POA: Diagnosis not present

## 2021-11-14 DIAGNOSIS — R519 Headache, unspecified: Secondary | ICD-10-CM

## 2021-11-14 LAB — CBC WITH DIFFERENTIAL/PLATELET
Abs Immature Granulocytes: 0.01 10*3/uL (ref 0.00–0.07)
Basophils Absolute: 0.1 10*3/uL (ref 0.0–0.1)
Basophils Relative: 1 %
Eosinophils Absolute: 0.1 10*3/uL (ref 0.0–0.5)
Eosinophils Relative: 1 %
HCT: 43.4 % (ref 39.0–52.0)
Hemoglobin: 15 g/dL (ref 13.0–17.0)
Immature Granulocytes: 0 %
Lymphocytes Relative: 30 %
Lymphs Abs: 1.6 10*3/uL (ref 0.7–4.0)
MCH: 32.5 pg (ref 26.0–34.0)
MCHC: 34.6 g/dL (ref 30.0–36.0)
MCV: 93.9 fL (ref 80.0–100.0)
Monocytes Absolute: 0.5 10*3/uL (ref 0.1–1.0)
Monocytes Relative: 9 %
Neutro Abs: 3.2 10*3/uL (ref 1.7–7.7)
Neutrophils Relative %: 59 %
Platelets: 211 10*3/uL (ref 150–400)
RBC: 4.62 MIL/uL (ref 4.22–5.81)
RDW: 11.7 % (ref 11.5–15.5)
WBC: 5.4 10*3/uL (ref 4.0–10.5)
nRBC: 0 % (ref 0.0–0.2)

## 2021-11-14 LAB — BASIC METABOLIC PANEL
Anion gap: 10 (ref 5–15)
BUN: 12 mg/dL (ref 6–20)
CO2: 25 mmol/L (ref 22–32)
Calcium: 9.2 mg/dL (ref 8.9–10.3)
Chloride: 103 mmol/L (ref 98–111)
Creatinine, Ser: 0.88 mg/dL (ref 0.61–1.24)
GFR, Estimated: >60 mL/min (ref >60)
Glucose, Bld: 136 mg/dL — ABNORMAL HIGH (ref 70–99)
Potassium: 4.2 mmol/L (ref 3.5–5.1)
Sodium: 138 mmol/L (ref 135–145)

## 2021-11-14 MED ORDER — KETOROLAC TROMETHAMINE 30 MG/ML IJ SOLN
30.0000 mg | Freq: Once | INTRAMUSCULAR | Status: DC
  Filled 2021-11-14: qty 1

## 2021-11-14 MED ORDER — AMLODIPINE BESYLATE 5 MG PO TABS
5.0000 mg | ORAL_TABLET | Freq: Once | ORAL | Status: AC
  Administered 2021-11-14: 5 mg via ORAL
  Filled 2021-11-14: qty 1

## 2021-11-14 MED ORDER — AMLODIPINE BESYLATE 5 MG PO TABS: 5.0000 mg | ORAL_TABLET | Freq: Every day | ORAL | 0 refills | Status: DC

## 2021-11-14 MED ORDER — IOHEXOL 350 MG/ML SOLN
75.0000 mL | Freq: Once | INTRAVENOUS | Status: AC | PRN
  Administered 2021-11-14: 75 mL via INTRAVENOUS

## 2021-11-14 MED ORDER — KETOROLAC TROMETHAMINE 15 MG/ML IJ SOLN
15.0000 mg | Freq: Once | INTRAMUSCULAR | Status: AC
  Administered 2021-11-14: 15 mg via INTRAVENOUS
  Filled 2021-11-14: qty 1

## 2021-11-14 NOTE — ED Triage Notes (Signed)
Pt to ED with c/o HTN, head pressure, and nausea since yesterday morning. Pt went to UC and they sent him to ED for evaluation. Pt states he used to take meds for BP but has not since March 2022 due to insurance issues.  ?

## 2021-11-14 NOTE — Discharge Instructions (Addendum)
You are seen in the emergency room today with headache and elevated blood pressure.  Your CT scan is normal.  Please begin taking your high blood pressure medicine once again.  I have listed the name of a primary care doctor to call for follow-up.  Please return with any new or suddenly worsening symptoms. ?

## 2021-11-14 NOTE — ED Provider Triage Note (Signed)
Emergency Medicine Provider Triage Evaluation Note ? ?Stephen Lozano , a 51 y.o. male  was evaluated in triage.  Pt complains of headache, nausea, and elevated blood pressure since yesterday.  He does have history of migraines but states this does not feel similar to his migraine headache.  Denies visual change, lightheadedness, or dizziness.  Denies chest pain, shortness of breath. ? ?Review of Systems  ?Positive: As above ?Negative: As above ? ?Physical Exam  ?BP (!) 161/103 (BP Location: Right Arm)   Pulse 81   Temp 98.6 ?F (37 ?C) (Oral)   Resp 16   Ht 6' (1.829 m)   Wt 95.7 kg   SpO2 97%   BMI 28.62 kg/m?  ?Gen:   Awake, no distress   ?Resp:  Normal effort  ?MSK:   Moves extremities without difficulty  ?Other:   ? ?Medical Decision Making  ?Medically screening exam initiated at 1:07 PM.  Appropriate orders placed.  Wayne Brittian was informed that the remainder of the evaluation will be completed by another provider, this initial triage assessment does not replace that evaluation, and the importance of remaining in the ED until their evaluation is complete. ? ? ?  ?Evlyn Courier, PA-C ?11/14/21 1308 ? ?

## 2021-11-14 NOTE — ED Provider Notes (Signed)
? ?Emergency Department Provider Note ? ? ?I have reviewed the triage vital signs and the nursing notes. ? ? ?HISTORY ? ?Chief Complaint ?Hypertension ? ? ?HPI ?Stephen Lozano is a 51 y.o. male with PMH of asthma and HTN, no longer on meds due to insurance issues, presents to the emergency department for evaluation of elevated blood pressure and headache.  Patient developed a sudden onset headache yesterday.  No clear provoking factors.  He describes diffuse throbbing headache without vision change, numbness/weakness, meningismus.  No change in mental status.  No nausea/vomiting.  He apparently went to urgent care today and was referred here for further evaluation. ? ? ?Past Medical History:  ?Diagnosis Date  ? Asthma   ? ? ?Review of Systems ? ?Constitutional: No fever/chills ?Eyes: No visual changes. ?ENT: No sore throat. ?Cardiovascular: Denies chest pain. Positive elevated BP.  ?Respiratory: Denies shortness of breath. ?Gastrointestinal: No abdominal pain.  No nausea, no vomiting.  No diarrhea.  No constipation. ?Genitourinary: Negative for dysuria. ?Musculoskeletal: Negative for back pain. ?Skin: Negative for rash. ?Neurological: Positive HA.  ? ?____________________________________________ ? ? ?PHYSICAL EXAM: ? ?VITAL SIGNS: ?ED Triage Vitals  ?Enc Vitals Group  ?   BP 11/14/21 1257 (!) 161/103  ?   Pulse Rate 11/14/21 1257 81  ?   Resp 11/14/21 1257 16  ?   Temp 11/14/21 1257 98.6 ?F (37 ?C)  ?   Temp Source 11/14/21 1257 Oral  ?   SpO2 11/14/21 1257 97 %  ?   Weight 11/14/21 1258 211 lb (95.7 kg)  ?   Height 11/14/21 1258 6' (1.829 m)  ? ?Constitutional: Alert and oriented. Well appearing and in no acute distress. ?Eyes: Conjunctivae are normal.  ?Head: Atraumatic. ?Nose: No congestion/rhinnorhea. ?Mouth/Throat: Mucous membranes are moist.  ?Neck: No stridor.  No meningeal signs.   ?Cardiovascular: Normal rate, regular rhythm. Good peripheral circulation. Grossly normal heart sounds.   ?Respiratory:  Normal respiratory effort.  No retractions. Lungs CTAB. ?Gastrointestinal: Soft and nontender. No distention.  ?Musculoskeletal: No lower extremity tenderness nor edema. No gross deformities of extremities. ?Neurologic:  Normal speech and language.  5/5 strength in the bilateral upper and lower extremities with normal sensation throughout.  ?Skin:  Skin is warm, dry and intact. No rash noted. ? ?____________________________________________ ?  ?LABS ?(all labs ordered are listed, but only abnormal results are displayed) ? ?Labs Reviewed  ?BASIC METABOLIC PANEL - Abnormal; Notable for the following components:  ?    Result Value  ? Glucose, Bld 136 (*)   ? All other components within normal limits  ?CBC WITH DIFFERENTIAL/PLATELET  ? ?____________________________________________ ? ?RADIOLOGY ? ?CT Head Wo Contrast ? ?Result Date: 11/14/2021 ?CLINICAL DATA:  Headache EXAM: CT HEAD WITHOUT CONTRAST TECHNIQUE: Contiguous axial images were obtained from the base of the skull through the vertex without intravenous contrast. RADIATION DOSE REDUCTION: This exam was performed according to the departmental dose-optimization program which includes automated exposure control, adjustment of the mA and/or kV according to patient size and/or use of iterative reconstruction technique. COMPARISON:  None Available. FINDINGS: Brain: No acute intracranial hemorrhage, mass effect, or herniation. No extra-axial fluid collections. No evidence of acute territorial infarct. No hydrocephalus. Vascular: No hyperdense vessel or unexpected calcification. Skull: Normal. Negative for fracture or focal lesion. Sinuses/Orbits: No acute finding. Other: None. IMPRESSION: No acute intracranial process identified. Electronically Signed   By: Ofilia Neas M.D.   On: 11/14/2021 14:59   ? ?____________________________________________ ? ? ?PROCEDURES ? ?Procedure(s) performed:  ? ?  Procedures ? ? ?____________________________________________ ? ? ?INITIAL  IMPRESSION / ASSESSMENT AND PLAN / ED COURSE ? ?Pertinent labs & imaging results that were available during my care of the patient were reviewed by me and considered in my medical decision making (see chart for details). ?  ?This patient is Presenting for Evaluation of HA, which does require a range of treatment options, and is a complaint that involves a high risk of morbidity and mortality. ? ?The Differential Diagnoses  includes but is not exclusive to subarachnoid hemorrhage, meningitis, encephalitis, previous head trauma, cavernous venous thrombosis, muscle tension headache, glaucoma, temporal arteritis, migraine or migraine equivalent, etc. ? ? ?Critical Interventions-  ?  ?Medications  ?amLODipine (NORVASC) tablet 5 mg (5 mg Oral Given 11/14/21 1822)  ?iohexol (OMNIPAQUE) 350 MG/ML injection 75 mL (75 mLs Intravenous Contrast Given 11/14/21 1925)  ?ketorolac (TORADOL) 15 MG/ML injection 15 mg (15 mg Intravenous Given 11/14/21 2023)  ? ? ?Reassessment after intervention: BP improved.  ? ? ?I decided to review pertinent External Data, and in summary no outside UC note available for review. ?  ?Clinical Laboratory Tests Ordered, included no leukocytosis or anemia.  No acute kidney injury. ? ?Radiologic Tests Ordered, included CT head. I independently interpreted the images and agree with radiology interpretation.  ? ?Cardiac Monitor Tracing which shows NSR. ? ? ?Social Determinants of Health Risk patient is a non-smoker.  ? ?Medical Decision Making: Summary:  ?Patient presents to the emergency department for evaluation of acute onset headache yesterday.  No focal neurodeficits.  No findings to strongly suspect infectious process.  CT head without contrast is unremarkable although onset of headache was yesterday.  Plan for CTA of the head and neck given the acute onset headache feature although lower suspicion for sentinel bleed from aneurysm.  ? ?Reevaluation with update and discussion with patient. Discussed labs  and imaging. Stable for discharge. ? ?Disposition: discharge ? ?____________________________________________ ? ?FINAL CLINICAL IMPRESSION(S) / ED DIAGNOSES ? ?Final diagnoses:  ?Acute nonintractable headache, unspecified headache type  ?Primary hypertension  ? ? ?Note:  This document was prepared using Dragon voice recognition software and may include unintentional dictation errors. ? ?Nanda Quinton, MD, FACEP ?Emergency Medicine ? ?  ?Margette Fast, MD ?11/20/21 1456 ? ?

## 2021-12-30 ENCOUNTER — Encounter: Payer: Self-pay | Admitting: Internal Medicine

## 2021-12-30 ENCOUNTER — Ambulatory Visit: Payer: BC Managed Care – PPO | Attending: Internal Medicine | Admitting: Internal Medicine

## 2021-12-30 VITALS — BP 164/93 | HR 85 | Temp 98.3°F | Resp 16 | Ht 72.0 in | Wt 219.0 lb

## 2021-12-30 DIAGNOSIS — I10 Essential (primary) hypertension: Secondary | ICD-10-CM | POA: Diagnosis not present

## 2021-12-30 DIAGNOSIS — Z23 Encounter for immunization: Secondary | ICD-10-CM | POA: Diagnosis not present

## 2021-12-30 DIAGNOSIS — R739 Hyperglycemia, unspecified: Secondary | ICD-10-CM | POA: Diagnosis not present

## 2021-12-30 DIAGNOSIS — Z1211 Encounter for screening for malignant neoplasm of colon: Secondary | ICD-10-CM

## 2021-12-30 DIAGNOSIS — Z7689 Persons encountering health services in other specified circumstances: Secondary | ICD-10-CM

## 2021-12-30 DIAGNOSIS — M7989 Other specified soft tissue disorders: Secondary | ICD-10-CM

## 2021-12-30 MED ORDER — AMLODIPINE BESYLATE 5 MG PO TABS: 5.0000 mg | ORAL_TABLET | Freq: Every day | ORAL | 3 refills | Status: DC

## 2021-12-31 LAB — LIPID PANEL
Chol/HDL Ratio: 6 ratio — ABNORMAL HIGH (ref 0.0–5.0)
Cholesterol, Total: 238 mg/dL — ABNORMAL HIGH (ref 100–199)
HDL: 40 mg/dL (ref >39)
LDL Chol Calc (NIH): 125 mg/dL — ABNORMAL HIGH (ref 0–99)
Triglycerides: 411 mg/dL — ABNORMAL HIGH (ref 0–149)
VLDL Cholesterol Cal: 73 mg/dL — ABNORMAL HIGH (ref 5–40)

## 2021-12-31 LAB — HEPATIC FUNCTION PANEL
ALT: 114 IU/L — ABNORMAL HIGH (ref 0–44)
AST: 72 IU/L — ABNORMAL HIGH (ref 0–40)
Albumin: 4.6 g/dL (ref 3.8–4.9)
Alkaline Phosphatase: 60 IU/L (ref 44–121)
Bilirubin Total: 0.5 mg/dL (ref 0.0–1.2)
Bilirubin, Direct: 0.17 mg/dL (ref 0.00–0.40)
Total Protein: 7.8 g/dL (ref 6.0–8.5)

## 2022-01-01 ENCOUNTER — Telehealth: Payer: Self-pay | Admitting: Internal Medicine

## 2022-01-01 ENCOUNTER — Ambulatory Visit: Payer: BC Managed Care – PPO | Attending: Internal Medicine

## 2022-01-01 DIAGNOSIS — R739 Hyperglycemia, unspecified: Secondary | ICD-10-CM

## 2022-01-01 DIAGNOSIS — R7989 Other specified abnormal findings of blood chemistry: Secondary | ICD-10-CM

## 2022-01-01 NOTE — Telephone Encounter (Signed)
Phone call placed to patient today to go over lab results.  Patient informed that several of his cholesterol levels including triglycerides and LDL cholesterols are elevated.  High cholesterol increases risk for heart attack and strokes.  Discussed the importance of healthy eating and regular exercise to help lower cholesterol.  We will plan to recheck on subsequent visit.  If still elevated at that time we will discuss starting cholesterol-lowering medication. Advised that 2 of his liver function tests are elevated but improved compared to when it was checked 8 months ago in the emergency room.  Patient reports occasional alcohol use.  Advised that we should screen for hepatitis B and C.  If these are negative, he most likely has what is called fatty liver which is fat deposited in the liver causing inflammation which can be seen in patients who are overweight or obese for height especially if they have diabetes with it.  Advised patient to return to the lab at his convenience to have screening test for hepatitis C and B done.  Informed of lab hours.  Patient expressed understanding and will return to the lab.  All questions were answered. I note that the lab was unable to add the A1c that I had ordered a few days ago.  I will have it drawn when he comes to have screening test for hepatitis B and hepatitis C.

## 2022-01-02 ENCOUNTER — Telehealth: Payer: Self-pay | Admitting: Internal Medicine

## 2022-01-02 LAB — HCV AB W REFLEX TO QUANT PCR: HCV Ab: NONREACTIVE

## 2022-01-02 LAB — HEMOGLOBIN A1C
Est. average glucose Bld gHb Est-mCnc: 154 mg/dL
Hgb A1c MFr Bld: 7 % — ABNORMAL HIGH (ref 4.8–5.6)

## 2022-01-02 LAB — HCV INTERPRETATION

## 2022-01-02 LAB — HEPATITIS B CORE ANTIBODY, TOTAL: Hep B Core Total Ab: NEGATIVE

## 2022-01-02 LAB — HEPATITIS B SURFACE ANTIBODY, QUANTITATIVE: Hepatitis B Surf Ab Quant: <3.1 m[IU]/mL — ABNORMAL LOW (ref >9.9)

## 2022-01-02 LAB — HEPATITIS B SURFACE ANTIGEN: Hepatitis B Surface Ag: NEGATIVE

## 2022-01-03 NOTE — Telephone Encounter (Signed)
Pt has been called and Vm was left informing him to return phone call. My chart message has also been sent to patient.

## 2022-01-07 ENCOUNTER — Encounter (HOSPITAL_COMMUNITY): Payer: Self-pay

## 2022-01-07 ENCOUNTER — Ambulatory Visit (HOSPITAL_COMMUNITY)
Admission: EM | Admit: 2022-01-07 | Discharge: 2022-01-07 | Disposition: A | Discharge status: Home / Self Care | Payer: BC Managed Care – PPO | Attending: Internal Medicine | Admitting: Internal Medicine

## 2022-01-07 DIAGNOSIS — R112 Nausea with vomiting, unspecified: Secondary | ICD-10-CM

## 2022-01-07 DIAGNOSIS — R197 Diarrhea, unspecified: Secondary | ICD-10-CM

## 2022-01-07 MED ORDER — ONDANSETRON 4 MG PO TBDP: 4.0000 mg | ORAL_TABLET | Freq: Three times a day (TID) | ORAL | 0 refills | Status: DC | PRN

## 2022-01-07 NOTE — Discharge Instructions (Signed)
I am suspicious that you have a viral stomach virus.  You are being treated with nausea medication to take as needed.  Please increase clear or fluid intake to prevent dehydration.  Follow-up if symptoms persist or worsen.

## 2022-01-07 NOTE — ED Triage Notes (Signed)
Patient having diarrhea and vomiting for the last 23 hours. No one around the Patient is having the same symptoms.  Last episode of emesis an hour ago, onset random. Went from yellow, brown, to now green.   Bowel movement has been green/brown liquid stool.   No medication or diet changes. Was on the way home from work when it started. Had upset stomach, ate chicken at home and then the emesis/diarrhea started.

## 2022-01-07 NOTE — ED Provider Notes (Signed)
[place]    CSN: [id number] Arrival date & time: 01/07/22  1647      History   Chief Complaint Chief Complaint  Patient presents with   Emesis   Diarrhea    HPI Stephen Lozano is a 51 y.o. male.   Patient presents with nausea, vomiting, diarrhea that started about 24 hours ago.  Denies blood in stool or emesis.  Patient reports mild abdominal pain in the center of the abdomen at times.  Denies any fever, body aches, chills, associated upper respiratory symptoms or cough.  Denies any known sick contacts.  Patient reports that he started not feeling well after eating a red bull that was hot in temperature.  Then, he ate some chicken at home and threw it up.  Patient has not traveled outside the [place]. He has not been able to keep any food or fluids down since symptoms started.    Emesis Diarrhea   Past Medical History:  Diagnosis Date   Asthma     Patient Active Problem List   Diagnosis Date Noted   Essential hypertension 12/30/2021    Past Surgical History:  Procedure Laterality Date   APPENDECTOMY     HERNIA REPAIR     INGUINAL HERNIA REPAIR Right        Home Medications    Prior to Admission medications   Medication Sig Start Date End Date Taking? Authorizing Provider  amLODipine (NORVASC) 5 MG tablet Take 1 tablet (5 mg total) by mouth daily. 12/30/21  Yes Ladell Pier, MD  ondansetron (ZOFRAN-ODT) 4 MG disintegrating tablet Take 1 tablet (4 mg total) by mouth every 8 (eight) hours as needed for nausea or vomiting. 01/07/22  Yes Madalaine Portier, Michele Rockers, FNP  albuterol (PROVENTIL HFA;VENTOLIN HFA) 108 (90 Base) MCG/ACT inhaler Inhale 2 puffs into the lungs every 6 (six) hours as needed for wheezing or shortness of breath.    [provider]  Multiple Vitamins-Minerals (MULTIVITAMIN WITH MINERALS) tablet Take 1 tablet by mouth daily.    [provider]    Family History Family History  Problem Relation Age of Onset    Diabetes Mother    Diabetes Father    Hypertension Maternal Aunt    Hypertension Maternal Uncle     Social History Social History   Tobacco Use   Smoking status: Never   Smokeless tobacco: Never  Vaping Use   Vaping Use: Some days   Devices: occasionally  Substance Use Topics   Alcohol use: Yes    Comment: occasional   Drug use: Never     Allergies   Shellfish allergy   Review of Systems Review of Systems Per HPI  Physical Exam Triage Vital Signs ED Triage Vitals  Enc Vitals Group     BP 01/07/22 1707 136/78     Pulse Rate 01/07/22 1707 94     Resp 01/07/22 1707 16     Temp 01/07/22 1707 99.1 F (37.3 C)     Temp Source 01/07/22 1707 Oral     SpO2 01/07/22 1707 96 %     Weight 01/07/22 1712 219 lb (99.3 kg)     Height 01/07/22 1712 '6\' 1"'$  (1.854 m)     Head Circumference --      Peak Flow --      Pain Score 01/07/22 1711 7     Pain Loc --      Pain Edu? --      Excl. in [place]? --  No data found.  Updated Vital Signs BP 136/78 (BP Location: Left Arm)   Pulse 94   Temp 99.1 F (37.3 C) (Oral)   Resp 16   Ht '6\' 1"'$  (1.854 m)   Wt 219 lb (99.3 kg)   SpO2 96%   BMI 28.89 kg/m   Visual Acuity Right Eye Distance:   Left Eye Distance:   Bilateral Distance:    Right Eye Near:   Left Eye Near:    Bilateral Near:     Physical Exam Constitutional:      General: He is not in acute distress.    Appearance: Normal appearance. He is not toxic-appearing or diaphoretic.  HENT:     Head: Normocephalic and atraumatic.     Mouth/Throat:     Mouth: Mucous membranes are moist.     Pharynx: No posterior oropharyngeal erythema.  Eyes:     Extraocular Movements: Extraocular movements intact.     Conjunctiva/sclera: Conjunctivae normal.  Cardiovascular:     Rate and Rhythm: Normal rate and regular rhythm.     Pulses: Normal pulses.     Heart sounds: Normal heart sounds.  Pulmonary:     Effort: Pulmonary effort is normal. No respiratory distress.     Breath  sounds: Normal breath sounds.  Abdominal:     General: Bowel sounds are normal. There is no distension.     Palpations: Abdomen is soft.     Tenderness: There is no abdominal tenderness.  Skin:    General: Skin is warm and dry.  Neurological:     General: No focal deficit present.     Mental Status: He is alert and oriented to person, place, and time. Mental status is at baseline.  Psychiatric:        Mood and Affect: Mood normal.        Behavior: Behavior normal.        Thought Content: Thought content normal.        Judgment: Judgment normal.      UC Treatments / Results  Labs (all labs ordered are listed, but only abnormal results are displayed) Labs Reviewed - No data to display  EKG   Radiology No results found.  Procedures Procedures (including critical care time)  Medications Ordered in UC Medications - No data to display  Initial Impression / Assessment and Plan / UC Course  I have reviewed the triage vital signs and the nursing notes.  Pertinent labs & imaging results that were available during my care of the patient were reviewed by me and considered in my medical decision making (see chart for details).     Suspect viral stomach virus versus food related illness.  No signs of dehydration on exam.  Ondansetron prescribed to take as needed for nausea.  Patient to ensure adequate fluid hydration.  Patient was given strict return and ER precautions.  Patient verbalized understanding and was agreeable with plan. Final Clinical Impressions(s) / UC Diagnoses   Final diagnoses:  Nausea vomiting and diarrhea     Discharge Instructions      I am suspicious that you have a viral stomach virus.  You are being treated with nausea medication to take as needed.  Please increase clear or fluid intake to prevent dehydration.  Follow-up if symptoms persist or worsen.    ED Prescriptions     Medication Sig Dispense Auth. Provider   ondansetron (ZOFRAN-ODT) 4 MG  disintegrating tablet Take 1 tablet (4 mg total) by mouth every 8 (  eight) hours as needed for nausea or vomiting. 20 tablet [place], Michele Rockers, [place]      PDMP not reviewed this encounter.   Teodora Medici, [place] 01/07/22 1726

## 2022-01-13 ENCOUNTER — Ambulatory Visit: Payer: BC Managed Care – PPO | Attending: Internal Medicine | Admitting: Internal Medicine

## 2022-01-13 DIAGNOSIS — R7989 Other specified abnormal findings of blood chemistry: Secondary | ICD-10-CM

## 2022-01-13 DIAGNOSIS — E119 Type 2 diabetes mellitus without complications: Secondary | ICD-10-CM | POA: Diagnosis not present

## 2022-01-13 NOTE — Progress Notes (Signed)
Patient ID: Stephen Lozano, male   DOB: 08/21/1970, 51 y.o.   MRN: [id number] Virtual Visit via Telephone Note  I connected with Stephen Lozano on 01/13/2022 at 1:27 PM by telephone and verified that I am speaking with the correct person using two identifiers  Location: Patient: home Provider: office  Participants: Myself Patient   I discussed the limitations, risks, security and privacy concerns of performing an evaluation and management service by telephone and the availability of in person appointments. I also discussed with the patient that there may be a patient responsible charge related to this service. The patient expressed understanding and agreed to proceed.   History of Present Illness: Patient with history of HTN, mild intermittent asthma, DM type 2, HL, abn LFTs.  This is a f/u visit for new dx of DM.   A1c on Recent labs came back at 7. Patient endorses family history of diabetes in both parents. Denies polyuria, polydipsia.  No numbness or tingling in the extremities. Since being informed of his diagnosis of diabetes he has started making dietary changes.  Cut out eating red meat and has cut back on pasta.  He has changed to whole-grain wheat bread. Reports having had eye exam about 1-1/2 to 2 months ago.  Told he had early signs of borderline glaucoma.  Change was made in his prescription glasses.  Patient also had elevation of AST and ALT on his lab test.  However elevation was much less than prior tests that was done.  Screen for hepatitis A and B was negative.   Outpatient Encounter Medications as of 01/13/2022  Medication Sig   albuterol (PROVENTIL HFA;VENTOLIN HFA) 108 (90 Base) MCG/ACT inhaler Inhale 2 puffs into the lungs every 6 (six) hours as needed for wheezing or shortness of breath.   amLODipine (NORVASC) 5 MG tablet Take 1 tablet (5 mg total) by mouth daily.   Multiple Vitamins-Minerals (MULTIVITAMIN WITH MINERALS) tablet Take 1 tablet by mouth daily.    ondansetron (ZOFRAN-ODT) 4 MG disintegrating tablet Take 1 tablet (4 mg total) by mouth every 8 (eight) hours as needed for nausea or vomiting.   No facility-administered encounter medications on file as of 01/13/2022.      Observations/Objective: Results for orders placed or performed in visit on 01/01/22  Hemoglobin A1c  Result Value Ref Range   Hgb A1c MFr Bld 7.0 (H) 4.8 - 5.6 %   Est. average glucose Bld gHb Est-mCnc 154 mg/dL  Hepatitis B core antibody, total  Result Value Ref Range   Hep B Core Total Ab Negative Negative  Hepatitis B surface antibody,quantitative  Result Value Ref Range   Hepatitis B Surf Ab Quant <3.1 (L) Immunity>9.9 mIU/mL  Hepatitis B surface antigen  Result Value Ref Range   Hepatitis B Surface Ag Negative Negative  HCV Ab w Reflex to Quant PCR  Result Value Ref Range   HCV Ab Non Reactive Non Reactive  Interpretation:  Result Value Ref Range   HCV Interp 1: Comment      Assessment and Plan: 1. New onset type 2 diabetes mellitus ([place]) Discussed diagnosis of diabetes potential complications and management. Patient advised to eliminate sugary drinks from the diet, cut back on portion sizes especially of white carbohydrates, eat more white lean meat like chicken [place] and seafood instead of beef or pork and incorporate fresh fruits and vegetables into the diet daily. -Encouraged to get in moderate intensity exercise at least 3 to 5 days a week. Discussed starting metformin but patient  wants to hold off for now stating that he wants to continue to work on improving his eating habits and see how he does between now and his follow-up visit in October.  Declines referral to nutritionist.   2. Abnormal LFTs Recommend follow-up in 1 month for repeat LFTs to see whether they continue to decrease and hopefully return to baseline.  If not, we may need to do a liver ultrasound.  Elevation may be due to fatty liver which can be seen in patients with diabetes who  are overweight for height.   Follow Up Instructions: Keep f/u appt in 04/2022.    I discussed the assessment and treatment plan with the patient. The patient was provided an opportunity to ask questions and all were answered. The patient agreed with the plan and demonstrated an understanding of the instructions.   The patient was advised to call back or seek an in-person evaluation if the symptoms worsen or if the condition fails to improve as anticipated.  I  Spent 13 minutes on this telephone encounter  This note has been created with Surveyor, quantity. Any transcriptional errors are unintentional.  Karle Plumber, MD

## 2022-01-14 ENCOUNTER — Ambulatory Visit (INDEPENDENT_AMBULATORY_CARE_PROVIDER_SITE_OTHER): Payer: BC Managed Care – PPO | Admitting: Orthopedic Surgery

## 2022-01-14 ENCOUNTER — Encounter: Payer: Self-pay | Admitting: Orthopedic Surgery

## 2022-01-14 ENCOUNTER — Ambulatory Visit (INDEPENDENT_AMBULATORY_CARE_PROVIDER_SITE_OTHER): Payer: BC Managed Care – PPO

## 2022-01-14 VITALS — Ht 73.0 in | Wt 219.0 lb

## 2022-01-14 DIAGNOSIS — M79645 Pain in left finger(s): Secondary | ICD-10-CM | POA: Diagnosis not present

## 2022-01-14 DIAGNOSIS — M152 Bouchard's nodes (with arthropathy): Secondary | ICD-10-CM

## 2022-01-14 NOTE — Progress Notes (Signed)
Office Visit Note   Patient: Stephen Lozano           Date of Birth: 1971-01-15           MRN: [id number] Visit Date: 01/14/2022              Requested by: Ladell Pier, MD [address] [place],  [place] [id number] PCP: Ladell Pier, MD   Assessment & Plan: Visit Diagnoses:  1. Pain in left finger(s)   2. 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  Overall, he has well-maintained range of motion of the PIP joint with 90 degrees of flexion compared to 100 degrees on the contralateral side.  We discussed the nature of arthritis and conservative and surgical treatment options.  After discussion, he would like to continue to monitor his symptoms for now.  He can see me back again as needed.  Follow-Up Instructions: No follow-ups on file.   Orders:  Orders Placed This Encounter  Procedures   XR Finger Index Left   No orders of the defined types were placed in this encounter.     Procedures: No procedures performed   Clinical Data: No additional findings.   Subjective: Chief Complaint  Patient presents with   Left Index Finger - Injury    Left handed    This is a 51 year old left-hand-dominant male who presents with painless swelling and stiffness of the left index finger PIP joint.  This all started after around 7 years ago when was taking a shower and felt a pop in his index finger at the PIP joint.  A similar thing happened about a year ago when he was working on a Therapist, occupational in his home.  He wore a splint for some time.  He has full extension of the finger but notes that he is not able to flex it is much as the contralateral index finger.  This is not painful for him.  Will occasionally swell but is only mildly swollen today.  Injury    Review of  Systems   Objective: Vital Signs: Ht '6\' 1"'$  (1.854 m)   Wt 219 lb (99.3 kg)   BMI 28.89 kg/m   Physical Exam Constitutional:      Appearance: Normal appearance.  Cardiovascular:     Rate and Rhythm: Normal rate.     Pulses: Normal pulses.  Pulmonary:     Effort: Pulmonary effort is normal.  Skin:    General: Skin is warm and dry.     Capillary Refill: Capillary refill takes less than 2 seconds.  Neurological:     Mental Status: He is alert.     Left Hand Exam   Tenderness  The patient is experiencing no tenderness.   Other  Erythema: absent Sensation: normal Pulse: present  Comments:  Index finger AROM at PIPJ of 0-90 degrees (0-100 deg on c/l side). Mild swelling compared to the right. No radial/ulnar instability in extension or mild flexion.       Specialty Comments:  No specialty comments available.  Imaging: No results found.   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 01/14/2022   Essential hypertension 12/30/2021   Past Medical History:  Diagnosis  Date   Asthma     Family History  Problem Relation Age of Onset   Diabetes Mother    Diabetes Father    Hypertension Maternal Aunt    Hypertension Maternal Uncle     Past Surgical History:  Procedure Laterality Date   APPENDECTOMY     HERNIA REPAIR     INGUINAL HERNIA REPAIR Right    Social History   Occupational History   Occupation: Writer  Tobacco Use   Smoking status: Never   Smokeless tobacco: Never  Vaping Use   Vaping Use: Some days   Devices: occasionally  Substance and Sexual Activity   Alcohol use: Yes    Comment: occasional   Drug use: Never   Sexual activity: Yes

## 2022-01-22 LAB — COLOGUARD

## 2022-02-04 ENCOUNTER — Ambulatory Visit: Payer: BC Managed Care – PPO

## 2022-02-04 ENCOUNTER — Ambulatory Visit: Payer: BC Managed Care – PPO | Attending: Internal Medicine | Admitting: Pharmacist

## 2022-02-04 VITALS — BP 146/79

## 2022-02-04 DIAGNOSIS — R7989 Other specified abnormal findings of blood chemistry: Secondary | ICD-10-CM | POA: Diagnosis not present

## 2022-02-04 DIAGNOSIS — I10 Essential (primary) hypertension: Secondary | ICD-10-CM

## 2022-02-04 MED ORDER — AMLODIPINE BESYLATE 10 MG PO TABS: 10.0000 mg | ORAL_TABLET | Freq: Every day | ORAL | 1 refills | Status: DC

## 2022-02-04 NOTE — Progress Notes (Signed)
   S:     No chief complaint on file.  Stephen Lozano is a 51 y.o. male who presents for hypertension evaluation, education, and management. PMH is significant for HTN, mild intermittent asthma, DM type 2, HL, abn LFTs. Patient was referred by Primary Care Provider, Dr. Wynetta Emery, on 12/30/2021.  At last visit, BP was 164/93 mmHg. Amlodipine was restarted.   Today, patient arrives in good spirits and presents without assistance. Denies dizziness, headache, blurred vision, swelling.   Patient reports hypertension was diagnosed ~2 years.   Family/Social history:  Fhx: HTN (mother's side)  Tobacco: former smoker. Stopped smoking ~2 years ago Alcohol: occasional    Medication adherence reported. Patient has taken BP medications today.   Current antihypertensives include: amlodipine 5 mg daily   Reported home BP readings:  - Checks twice weekly  - 138-144/86-102 mmHg  - Tries to check in the morning and night   Patient reported dietary habits:  - Caffeine: none reported  - Compliant with salt restriction   Patient-reported exercise habits:  - 15-20 minutes on a bike daily   O:  ROS  Physical Exam  Last 3 Office BP readings: BP Readings from Last 3 Encounters:  01/07/22 136/78  12/30/21 (!) 164/93  11/14/21 (!) 177/93    BMET    Component Value Date/Time   NA 138 11/14/2021 1311   K 4.2 11/14/2021 1311   CL 103 11/14/2021 1311   CO2 25 11/14/2021 1311   GLUCOSE 136 (H) 11/14/2021 1311   BUN 12 11/14/2021 1311   CREATININE 0.88 11/14/2021 1311   CALCIUM 9.2 11/14/2021 1311   GFRNONAA >60 11/14/2021 1311   GFRAA >60 02/06/2018 0317    Renal function: CrCl cannot be calculated (Patient's most recent lab result is older than the maximum 21 days allowed.).  Clinical ASCVD: No  The 10-year ASCVD risk score (Arnett DK, et al., 2019) is: 17%   Values used to calculate the score:     Age: 38 years     Sex: Male     Is Non-Hispanic African American: No      Diabetic: Yes     Tobacco smoker: No     Systolic Blood Pressure: 831 mmHg     Is BP treated: Yes     HDL Cholesterol: 40 mg/dL     Total Cholesterol: 238 mg/dL   A/P: Hypertension longstanding currently above goal but improved on current medications. BP goal < 130/80 mmHg. Medication adherence appears appropriate.  -Increased dose of amlodipine to 10 mg daily.  -Patient educated on purpose, proper use, and potential adverse effects of amlodipine.  -F/u labs ordered - none -Counseled on lifestyle modifications for blood pressure control including reduced dietary sodium, increased exercise, adequate sleep. -Encouraged patient to check BP at home and bring log of readings to next visit. Counseled on proper use of home BP cuff.    Results reviewed and written information provided.    Written patient instructions provided. Patient verbalized understanding of treatment plan.  Total time in face to face counseling 30 minutes.    Follow-up:  Pharmacist in 1-1.5 months or prn. PCP clinic visit in October.    Benard Halsted, PharmD, [facility] [place], [place] [phone]

## 2022-02-05 LAB — HEPATIC FUNCTION PANEL
ALT: 49 IU/L — ABNORMAL HIGH (ref 0–44)
AST: 48 IU/L — ABNORMAL HIGH (ref 0–40)
Albumin: 4.3 g/dL (ref 3.8–4.9)
Alkaline Phosphatase: 58 IU/L (ref 44–121)
Bilirubin Total: 0.7 mg/dL (ref 0.0–1.2)
Bilirubin, Direct: 0.2 mg/dL (ref 0.00–0.40)
Total Protein: 7.4 g/dL (ref 6.0–8.5)

## 2022-02-16 DIAGNOSIS — Z1211 Encounter for screening for malignant neoplasm of colon: Secondary | ICD-10-CM | POA: Diagnosis not present

## 2022-02-25 LAB — COLOGUARD: COLOGUARD: NEGATIVE

## 2022-05-02 ENCOUNTER — Encounter: Payer: Self-pay | Admitting: Internal Medicine

## 2022-05-02 ENCOUNTER — Ambulatory Visit: Payer: BC Managed Care – PPO | Attending: Internal Medicine | Admitting: Internal Medicine

## 2022-05-02 VITALS — BP 145/84 | HR 78 | Ht 72.0 in | Wt 216.6 lb

## 2022-05-02 DIAGNOSIS — I152 Hypertension secondary to endocrine disorders: Secondary | ICD-10-CM | POA: Diagnosis not present

## 2022-05-02 DIAGNOSIS — E1169 Type 2 diabetes mellitus with other specified complication: Secondary | ICD-10-CM | POA: Diagnosis not present

## 2022-05-02 DIAGNOSIS — E1159 Type 2 diabetes mellitus with other circulatory complications: Secondary | ICD-10-CM | POA: Diagnosis not present

## 2022-05-02 DIAGNOSIS — Z2821 Immunization not carried out because of patient refusal: Secondary | ICD-10-CM

## 2022-05-02 DIAGNOSIS — Z7984 Long term (current) use of oral hypoglycemic drugs: Secondary | ICD-10-CM

## 2022-05-02 DIAGNOSIS — R7989 Other specified abnormal findings of blood chemistry: Secondary | ICD-10-CM

## 2022-05-02 DIAGNOSIS — E782 Mixed hyperlipidemia: Secondary | ICD-10-CM | POA: Diagnosis not present

## 2022-05-02 LAB — GLUCOSE, POCT (MANUAL RESULT ENTRY): POC Glucose: 220 mg/dl — AB (ref 70–99)

## 2022-05-02 LAB — POCT GLYCOSYLATED HEMOGLOBIN (HGB A1C): HbA1c, POC (controlled diabetic range): 6.7 % (ref 0.0–7.0)

## 2022-05-02 MED ORDER — VALSARTAN 40 MG PO TABS: 40.0000 mg | ORAL_TABLET | Freq: Every day | ORAL | 5 refills | Status: DC

## 2022-05-02 NOTE — Patient Instructions (Signed)
Your A1c has improved.  Your level today is 6.7.  Keep up the good works with healthy eating habits and regular exercise.  Your blood pressure is not at goal.  Continue amlodipine 10 mg daily.  We have added another blood pressure medication called valsartan 40 mg daily.

## 2022-05-02 NOTE — Progress Notes (Signed)
Patient ID: Stephen Lozano, male    DOB: [birth year]  MRN: [id number]  CC:  chronic ds management  Subjective: Stephen Lozano is a 51 y.o. male who presents for chronic ds management His concerns today include:  Patient with history of HTN, mild intermittent asthma, DM type 2, HL, abn LFTs.    DM: Diagnosed with having diabetes in June of this year.  Patient wanted to hold off on medication.  Wanted to work on improving his eating habits and getting in regular exercise. Today readings are BS 220/A1C 6.7 Eating habits: cut back on red meat, diary products, white starches.  Does not drink sugar drinks. Exercise:  walking 2 miles a day  HYPERTENSION Currently taking: see medication list Med Adherence: '[x]'$  Yes    '[]'$  No Medication side effects: '[]'$  Yes    '[x]'$  No Adherence with salt restriction: '[x]'$  Yes    '[]'$  No Home Monitoring?: '[x]'$  Yes    '[]'$  No Monitoring Frequency: 2x/wk Home BP results range: Last 2 readings -140/78, 135/72 SOB? '[]'$  Yes    '[x]'$  No Chest Pain?: '[]'$  Yes    '[x]'$  No Leg swelling?: '[]'$  Yes    '[x]'$  No Headaches?: '[]'$  Yes    '[x]'$  No Dizziness? '[]'$  Yes    '[x]'$  No Comments:   Patient has had elevation in AST and ALT but has trended down towards normal the past 2 times that it has been checked.  LDL cholesterol in June was 125.  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     amLODipine (NORVASC) 10 MG tablet Take 1 tablet (10 mg total) by mouth daily. 90 tablet 1   Multiple Vitamins-Minerals (MULTIVITAMIN WITH MINERALS) tablet Take 1 tablet  by mouth daily.     ondansetron (ZOFRAN-ODT) 4 MG disintegrating tablet Take 1 tablet (4 mg total) by mouth every 8 (eight) hours as needed for nausea or vomiting. 20 tablet 0   No current facility-administered medications on file prior to visit.    Allergies  Allergen Reactions   Shellfish Allergy Anaphylaxis    Social History   Socioeconomic History   Marital status: Single    Spouse name: Not on file   Number of children: 6   Years of education: Not on file   Highest education level: Bachelor's degree (e.g., BA, AB, BS)  Occupational History   Occupation: Writer  Tobacco Use   Smoking status: Never   Smokeless tobacco: Never  Vaping Use   Vaping Use: Some days   Devices: occasionally  Substance and Sexual Activity   Alcohol use: Yes    Comment: occasional   Drug use: Never   Sexual activity: Yes  Other Topics Concern   Not on file  Social History Narrative   Not on file   Social Determinants of Health   Financial Resource Strain: Not on file  Food Insecurity: Not on file  Transportation Needs: Not on file  Physical Activity: Not on file  Stress: Not on file  Social Connections: Not on file  Intimate Partner Violence: Not  on file    Family History  Problem Relation Age of Onset   Diabetes Mother    Diabetes Father    Hypertension Maternal Aunt    Hypertension Maternal Uncle     Past Surgical History:  Procedure Laterality Date   APPENDECTOMY     HERNIA REPAIR     INGUINAL HERNIA REPAIR Right     ROS: Review of Systems Negative except as stated above  PHYSICAL EXAM: BP (!) 158/78   Pulse 78   Ht 6' (1.829 m)   Wt 216 lb 9.6 oz (98.2 kg)   SpO2 95%   BMI 29.38 kg/m   Wt Readings from Last 3 Encounters:  05/02/22 216 lb 9.6 oz (98.2 kg)  01/14/22 219 lb (99.3 kg)  01/07/22 219 lb (99.3 kg)    Physical Exam   General appearance - alert, well appearing, middle-age African-American male and in no distress Mental status -  normal mood, behavior, speech, dress, motor activity, and thought processes Neck - supple, no significant adenopathy Chest - clear to auscultation, no wheezes, rales or rhonchi, symmetric air entry Heart - normal rate, regular rhythm, normal S1, S2, no murmurs, rubs, clicks or gallops Extremities - peripheral pulses normal, no pedal edema, no clubbing or cyanosis     Latest Ref Rng & Units 02/04/2022    3:12 PM 12/30/2021    3:45 PM 11/14/2021    1:11 PM  CMP  Glucose 70 - 99 mg/dL   136   BUN 6 - 20 mg/dL   12   Creatinine 0.61 - 1.24 mg/dL   0.88   Sodium 135 - 145 mmol/L   138   Potassium 3.5 - 5.1 mmol/L   4.2   Chloride 98 - 111 mmol/L   103   CO2 22 - 32 mmol/L   25   Calcium 8.9 - 10.3 mg/dL   9.2   Total Protein 6.0 - 8.5 g/dL 7.4  7.8    Total Bilirubin 0.0 - 1.2 mg/dL 0.7  0.5    Alkaline Phos 44 - 121 IU/L 58  60    AST 0 - 40 IU/L 48  72    ALT 0 - 44 IU/L 49  114     Lipid Panel     Component Value Date/Time   CHOL 238 (H) 12/30/2021 1545   TRIG 411 (H) 12/30/2021 1545   HDL 40 12/30/2021 1545   CHOLHDL 6.0 (H) 12/30/2021 1545   LDLCALC 125 (H) 12/30/2021 1545    CBC    Component Value Date/Time   WBC 5.4 11/14/2021 1311   RBC 4.62 11/14/2021 1311   HGB 15.0 11/14/2021 1311   HCT 43.4 11/14/2021 1311   PLT 211 11/14/2021 1311   MCV 93.9 11/14/2021 1311   MCH 32.5 11/14/2021 1311   MCHC 34.6 11/14/2021 1311   RDW 11.7 11/14/2021 1311   LYMPHSABS 1.6 11/14/2021 1311   MONOABS 0.5 11/14/2021 1311   EOSABS 0.1 11/14/2021 1311   BASOSABS 0.1 11/14/2021 1311   Results for orders placed or performed in visit on 05/02/22  POCT glycosylated hemoglobin (Hb A1C)  Result Value Ref Range   Hemoglobin A1C     HbA1c POC (<> result, manual entry)     HbA1c, POC (prediabetic range)     HbA1c, POC (controlled diabetic range) 6.7 0.0 - 7.0 %  POCT glucose (manual entry)  Result Value Ref Range   POC Glucose 220 (A) 70 - 99 mg/dl    ASSESSMENT  AND PLAN: 1. DM  type 2 with diabetic mixed hyperlipidemia ([facility]) A1c at goal with diet and exercise.  Encouraged him to keep up the good works. Ideally should be on statin therapy but will hold off and recheck LFTs and lipid profile again today. - POCT glycosylated hemoglobin (Hb A1C) - POCT glucose (manual entry) - Lipid panel  2. Hypertension associated with type 2 diabetes mellitus ([facility]) Not at goal.  Continue Norvasc 10 mg daily.  Patient agreeable to adding valsartan 40 mg daily.  Continue to monitor blood pressure with goal being 130/80 or lower. - valsartan (DIOVAN) 40 MG tablet; Take 1 tablet (40 mg total) by mouth daily.  Dispense: 30 tablet; Refill: 5 - Microalbumin / creatinine urine ratio  3. Abnormal LFTs - Hepatic Function Panel  4. Influenza vaccination declined  Patient was given the opportunity to ask questions.  Patient verbalized understanding of the plan and was able to repeat key elements of the plan.   This documentation was completed using Radio producer.  Any transcriptional errors are unintentional.  Orders Placed This Encounter  Procedures   POCT glycosylated hemoglobin (Hb A1C)   POCT glucose (manual entry)     Requested Prescriptions    No prescriptions requested or ordered in this encounter    No follow-ups on file.  Karle Plumber, MD, FACP

## 2022-05-03 ENCOUNTER — Other Ambulatory Visit: Payer: Self-pay | Admitting: Internal Medicine

## 2022-05-03 DIAGNOSIS — R7989 Other specified abnormal findings of blood chemistry: Secondary | ICD-10-CM

## 2022-05-04 LAB — HEPATIC FUNCTION PANEL
ALT: 150 IU/L — ABNORMAL HIGH (ref 0–44)
AST: 151 IU/L — ABNORMAL HIGH (ref 0–40)
Albumin: 4.4 g/dL (ref 3.8–4.9)
Alkaline Phosphatase: 69 IU/L (ref 44–121)
Bilirubin Total: 0.6 mg/dL (ref 0.0–1.2)
Bilirubin, Direct: 0.2 mg/dL (ref 0.00–0.40)
Total Protein: 7.6 g/dL (ref 6.0–8.5)

## 2022-05-04 LAB — MICROALBUMIN / CREATININE URINE RATIO
Creatinine, Urine: 183.6 mg/dL
Microalb/Creat Ratio: 78 mg/g creat — ABNORMAL HIGH (ref 0–29)
Microalbumin, Urine: 142.6 ug/mL

## 2022-05-04 LAB — LIPID PANEL
Chol/HDL Ratio: 5.4 ratio — ABNORMAL HIGH (ref 0.0–5.0)
Cholesterol, Total: 228 mg/dL — ABNORMAL HIGH (ref 100–199)
HDL: 42 mg/dL (ref >39)
LDL Chol Calc (NIH): 109 mg/dL — ABNORMAL HIGH (ref 0–99)
Triglycerides: 449 mg/dL — ABNORMAL HIGH (ref 0–149)
VLDL Cholesterol Cal: 77 mg/dL — ABNORMAL HIGH (ref 5–40)

## 2022-05-09 ENCOUNTER — Encounter: Payer: Self-pay | Admitting: Nurse Practitioner

## 2022-06-20 ENCOUNTER — Encounter: Payer: Self-pay | Admitting: Nurse Practitioner

## 2022-06-20 ENCOUNTER — Ambulatory Visit (INDEPENDENT_AMBULATORY_CARE_PROVIDER_SITE_OTHER): Payer: BC Managed Care – PPO | Admitting: Nurse Practitioner

## 2022-06-20 ENCOUNTER — Other Ambulatory Visit (INDEPENDENT_AMBULATORY_CARE_PROVIDER_SITE_OTHER): Payer: BC Managed Care – PPO

## 2022-06-20 VITALS — BP 138/76 | HR 71 | Ht 72.0 in | Wt 219.0 lb

## 2022-06-20 DIAGNOSIS — R7989 Other specified abnormal findings of blood chemistry: Secondary | ICD-10-CM

## 2022-06-20 DIAGNOSIS — K760 Fatty (change of) liver, not elsewhere classified: Secondary | ICD-10-CM | POA: Diagnosis not present

## 2022-06-20 LAB — BASIC METABOLIC PANEL
BUN: 13 mg/dL (ref 6–23)
CO2: 25 mEq/L (ref 19–32)
Calcium: 9.4 mg/dL (ref 8.4–10.5)
Chloride: 102 mEq/L (ref 96–112)
Creatinine, Ser: 0.88 mg/dL (ref 0.40–1.50)
GFR: 98.2 mL/min (ref >60.00)
Glucose, Bld: 104 mg/dL — ABNORMAL HIGH (ref 70–99)
Potassium: 3.6 mEq/L (ref 3.5–5.1)
Sodium: 136 mEq/L (ref 135–145)

## 2022-06-20 LAB — CBC WITH DIFFERENTIAL/PLATELET
Basophils Absolute: 0.1 10*3/uL (ref 0.0–0.1)
Basophils Relative: 0.9 % (ref 0.0–3.0)
Eosinophils Absolute: 0.2 10*3/uL (ref 0.0–0.7)
Eosinophils Relative: 2.7 % (ref 0.0–5.0)
HCT: 39.6 % (ref 39.0–52.0)
Hemoglobin: 13.9 g/dL (ref 13.0–17.0)
Lymphocytes Relative: 30.8 % (ref 12.0–46.0)
Lymphs Abs: 2.1 10*3/uL (ref 0.7–4.0)
MCHC: 35.2 g/dL (ref 30.0–36.0)
MCV: 92.8 fl (ref 78.0–100.0)
Monocytes Absolute: 0.7 10*3/uL (ref 0.1–1.0)
Monocytes Relative: 9.9 % (ref 3.0–12.0)
Neutro Abs: 3.9 10*3/uL (ref 1.4–7.7)
Neutrophils Relative %: 55.7 % (ref 43.0–77.0)
Platelets: 238 10*3/uL (ref 150.0–400.0)
RBC: 4.27 Mil/uL (ref 4.22–5.81)
RDW: 12.6 % (ref 11.5–15.5)
WBC: 7 10*3/uL (ref 4.0–10.5)

## 2022-06-20 LAB — HEPATIC FUNCTION PANEL
ALT: 144 U/L — ABNORMAL HIGH (ref 0–53)
AST: 120 U/L — ABNORMAL HIGH (ref 0–37)
Albumin: 4.6 g/dL (ref 3.5–5.2)
Alkaline Phosphatase: 65 U/L (ref 39–117)
Bilirubin, Direct: 0.2 mg/dL (ref 0.0–0.3)
Total Bilirubin: 1 mg/dL (ref 0.2–1.2)
Total Protein: 8.2 g/dL (ref 6.0–8.3)

## 2022-06-20 LAB — PROTIME-INR
INR: 1.1 ratio — ABNORMAL HIGH (ref 0.8–1.0)
Prothrombin Time: 12.4 s (ref 9.6–13.1)

## 2022-06-20 LAB — FERRITIN: Ferritin: 825.1 ng/mL — ABNORMAL HIGH (ref 22.0–322.0)

## 2022-06-20 LAB — IRON: Iron: 111 ug/dL (ref 42–165)

## 2022-06-20 NOTE — Progress Notes (Signed)
06/20/2022 Stephen Lozano [id number] 1970-11-27   CHIEF COMPLAINT: Elevated liver enzymes  HISTORY OF PRESENT ILLNESS: Stephen Lozano is a 51 year old male with past medical history of asthma and hypertension. He presents to our office today as referred by Karle Plumber for further evaluation regarding elevated AST/ALT levels with normal total bili and alk phos levels.  He underwent routine laboratory studies at the time of his physical exam 12/30/2021 which showed an AST level of 72 and ALT 114.  Repeat labs 02/04/2022 AST 48 and ALT 49.  Repeat labs 05/02/2022 AST 151 and ALT 150. He drank 4 glasses of red wine on 04/30/2022 and he questions if that caused the bump in his LFTs. He typically drinks 4-5 shots of tequila on the weekends.  No drug use.  Abdominal imaging has not been done.  No jaundice.  No confusion or mental fogginess.  He reported having acute illness July 2023 with excessive nausea, vomiting and diarrhea which lasted for about 20 hours and occurred after he drank 2 cans of red bull.  He stated he lost 20 pounds in 36 hours then quickly regained back the lost weight.  No further episodes of N/V/D since then. He denies having any history of liver disease. No family history of liver disease.  He sometimes has brief generalized abdominal gas discomfort.  No significant abdominal pain. He is passing a normal formed brown stool once daily.  He underwent a Cologuard test 02/16/2022 which was negative.  No fevers, sweats or chills.  No antibiotics within the past 6 months.  He was started on Amlodipine June 2023 and Valsartan approximately 1 and half months ago.     Latest Ref Rng & Units 05/02/2022    4:41 PM 02/04/2022    3:12 PM 12/30/2021    3:45 PM  CMP  Total Protein 6.0 - 8.5 g/dL 7.6  7.4  7.8   Total Bilirubin 0.0 - 1.2 mg/dL 0.6  0.7  0.5   Alkaline Phos 44 - 121 IU/L 69  58  60   AST 0 - 40 IU/L 151  48  72   ALT 0 - 44 IU/L 150  49  114        Latest Ref Rng & Units  11/14/2021    1:11 PM 02/19/2021   12:33 PM 04/18/2020    4:48 PM  CBC  WBC 4.0 - 10.5 K/uL 5.4  6.2  6.3   Hemoglobin 13.0 - 17.0 g/dL 15.0  15.2  15.0   Hematocrit 39.0 - 52.0 % 43.4  43.3  45.0   Platelets 150 - 400 K/uL 211  188  203     Cologuard negative 02/16/2022: Negative  Past Medical History:  Diagnosis Date   Asthma    Past Surgical History:  Procedure Laterality Date   APPENDECTOMY     INGUINAL HERNIA REPAIR Right    Social History: He smoked cigarettes socially off and on x 10 years, quit 2 years ago. He drinks tequila 4 or 5 shot and Friday and Saturdays. No drug use.   Family History: No family history of esophageal, gastric or colon cancer. Mother and father had diabetes. Maternal uncle and aunt with hypertension.   Allergies  Allergen Reactions   Shellfish Allergy Anaphylaxis      Outpatient Encounter Medications as of 06/20/2022  Medication Sig   albuterol (PROVENTIL HFA;VENTOLIN HFA) 108 (90 Base) MCG/ACT inhaler Inhale 2 puffs into the lungs every 6 (six) hours as needed for  wheezing or shortness of breath.   amLODipine (NORVASC) 10 MG tablet Take 1 tablet (10 mg total) by mouth daily.   Multiple Vitamins-Minerals (MULTIVITAMIN WITH MINERALS) tablet Take 1 tablet by mouth daily.   valsartan (DIOVAN) 40 MG tablet Take 1 tablet (40 mg total) by mouth daily.   [DISCONTINUED] ondansetron (ZOFRAN-ODT) 4 MG disintegrating tablet Take 1 tablet (4 mg total) by mouth every 8 (eight) hours as needed for nausea or vomiting.   No facility-administered encounter medications on file as of 06/20/2022.   REVIEW OF SYSTEMS:  Gen: Denies fever, sweats or chills. No weight loss.  CV: Denies chest pain, palpitations or edema. Resp: Denies cough, shortness of breath of hemoptysis.  GI: See HPI. GU : Denies urinary burning, blood in urine, increased urinary frequency or incontinence. MS: Denies joint pain, muscles aches or weakness. Derm: Denies rash, itchiness, skin  lesions or unhealing ulcers. Psych: Denies depression, anxiety, memory loss or confusion. Heme: Denies bruising, easy bleeding. Neuro:  Denies headaches, dizziness or paresthesias. Endo:  Denies any problems with DM, thyroid or adrenal function.  PHYSICAL EXAM: Pulse 71   Ht 6' (1.829 m)   Wt 219 lb (99.3 kg)   BMI 29.70 kg/m  General: Well developed ... in no acute distress. Head: Normocephalic and atraumatic. Eyes:  Sclerae non-icteric, conjunctive pink. Ears: Normal auditory acuity. Mouth: Dentition intact. No ulcers or lesions.  Neck: Supple, no lymphadenopathy or thyromegaly.  Lungs: Clear bilaterally to auscultation without wheezes, crackles or rhonchi. Heart: Regular rate and rhythm. No murmur, rub or gallop appreciated.  Abdomen: Soft, nontender, non distended. No masses. No hepatosplenomegaly. Normoactive bowel sounds x 4 quadrants.  Rectal: Deferred. Musculoskeletal: Symmetrical with no gross deformities. Skin: Warm and dry. No rash or lesions on visible extremities. Extremities: No edema. Neurological: Alert oriented x 4, no focal deficits.  Psychological:  Alert and cooperative. Normal mood and affect.  ASSESSMENT AND PLAN:  37) 51 year old male with elevated AST/ALT levels, normal T. Bil and Alk phos levels. He had acute N/V/D for 36 hours July 2023 without recurrence. No abdominal pain. He was started on Amlodipine June 2023 and Valsartan approximately 1 and half months ago, possible DILI.  -RUQ sonogram to further evaluate the liver and gallbladder -Recommended no alcohol -BMP, hepatic panel, hepatitis A total antibody, hepatitis A IgM antibody, hep B surface antigen, hep B surface antibody, hep B core total antibody, ANA, ASMA, AMA, IgG, ceruloplasmin, alpha-1 antitrypsin, iron, ferritin and INR -Further recommendations to be determined after abdominal sonogram and the above lab results reviewed  2) Colon cancer screening.  Negative Cologuard 02/2022. -Recommend  repeat Cologuard vs conventional colonoscopy 02/2025  3) Hypertension on Amlodipine and Valsartan       CC:  Ladell Pier, MD

## 2022-06-20 NOTE — Patient Instructions (Signed)
Your provider has requested that you go to the basement level for lab work before leaving today. Press "B" on the elevator. The lab is located at the first door on the left as you exit the elevator.   You have been scheduled for an abdominal ultrasound at [facility] (1st floor of hospital) on 06/27/22 at 8:30 am. Please arrive 30 minutes prior to your appointment for registration. Make certain not to have anything to eat or drink after midnight prior to your appointment. Should you need to reschedule your appointment, please contact radiology at [phone]. This test typically takes about 30 minutes to perform.   -No alcohol.  Further follow up to be determined after lab & abdominal ultrasound results are reviewed.  Due to recent changes in healthcare laws, you may see the results of your imaging and laboratory studies on MyChart before your provider has had a chance to review them.  We understand that in some cases there may be results that are confusing or concerning to you. Not all laboratory results come back in the same time frame and the provider may be waiting for multiple results in order to interpret others.  Please give [place] 48 hours in order for your provider to thoroughly review all the results before contacting the office for clarification of your results.    Thank you for trusting me with your gastrointestinal care!   Carl Best, CRNP

## 2022-06-23 NOTE — Progress Notes (Signed)
____________________________________________________________  Attending physician addendum:  Thank you for sending this case to me. I have reviewed the entire note and agree with the plan.  Suspect this patient may have both alcohol-related and metabolic fatty liver disease given reported social history and significant dyslipidemia with elevated cholesterol and triglycerides.  His ferritin is 850 with an normal iron level (no iron saturation some reason) Ideally, this patient will be abstinent from alcohol for months so we can recheck his LFTs and iron studies.  Please have your medical assistant contact radiology and make sure that the ultrasound scheduled for 07/01/2022 is done with Doppler studies as well.  Wilfrid Lund, MD  ____________________________________________________________

## 2022-06-24 ENCOUNTER — Other Ambulatory Visit (HOSPITAL_COMMUNITY): Payer: Self-pay | Admitting: Nurse Practitioner

## 2022-06-24 DIAGNOSIS — R7989 Other specified abnormal findings of blood chemistry: Secondary | ICD-10-CM

## 2022-06-24 NOTE — Progress Notes (Signed)
DD, pls contact patient and let him know to avoid all alcohol and repeat hepatic panel with iron, iron saturation, TIBC and ferritin level and contact radiology to make sure a doppler study is done at time of his RUQ sono. See Dr. Loletha Carrow' addendum below. Please have your medical assistant contact radiology and make sure that the ultrasound scheduled for 07/01/2022 is done with Doppler studies as well.

## 2022-06-26 LAB — HEPATITIS C ANTIBODY: Hepatitis C Ab: NONREACTIVE

## 2022-06-26 LAB — IGG: IgG (Immunoglobin G), Serum: 1812 mg/dL — ABNORMAL HIGH (ref 600–1640)

## 2022-06-26 LAB — CERULOPLASMIN: Ceruloplasmin: 25 mg/dL (ref 18–36)

## 2022-06-26 LAB — HEPATITIS B CORE ANTIBODY, TOTAL: Hep B Core Total Ab: NONREACTIVE

## 2022-06-26 LAB — ALPHA-1-ANTITRYPSIN: A-1 Antitrypsin, Ser: 117 mg/dL (ref 83–199)

## 2022-06-26 LAB — ANTI-SMOOTH MUSCLE ANTIBODY, IGG: Actin (Smooth Muscle) Antibody (IGG): <20 U (ref <20)

## 2022-06-26 LAB — ANA: Anti Nuclear Antibody (ANA): NEGATIVE

## 2022-06-26 LAB — HEPATITIS B SURFACE ANTIBODY,QUALITATIVE: Hep B S Ab: NONREACTIVE

## 2022-06-26 LAB — HEPATITIS B SURFACE ANTIGEN: Hepatitis B Surface Ag: NONREACTIVE

## 2022-06-26 LAB — HEPATITIS A ANTIBODY, TOTAL: Hepatitis A AB,Total: NONREACTIVE

## 2022-06-26 LAB — MITOCHONDRIAL ANTIBODIES: Mitochondrial M2 Ab, IgG: <=20 U (ref <=20.0)

## 2022-06-26 LAB — HEPATITIS A ANTIBODY, IGM: Hep A IgM: NONREACTIVE

## 2022-06-27 ENCOUNTER — Ambulatory Visit (HOSPITAL_COMMUNITY): Payer: BC Managed Care – PPO

## 2022-07-01 ENCOUNTER — Telehealth: Payer: Self-pay | Admitting: Nurse Practitioner

## 2022-07-01 ENCOUNTER — Ambulatory Visit (HOSPITAL_COMMUNITY)
Admission: RE | Admit: 2022-07-01 | Discharge: 2022-07-01 | Disposition: A | Discharge status: Home / Self Care | Payer: BC Managed Care – PPO | Source: Ambulatory Visit | Attending: Nurse Practitioner | Admitting: Nurse Practitioner

## 2022-07-01 DIAGNOSIS — R7989 Other specified abnormal findings of blood chemistry: Secondary | ICD-10-CM | POA: Insufficient documentation

## 2022-07-01 DIAGNOSIS — R945 Abnormal results of liver function studies: Secondary | ICD-10-CM | POA: Diagnosis not present

## 2022-07-01 DIAGNOSIS — K760 Fatty (change of) liver, not elsewhere classified: Secondary | ICD-10-CM | POA: Diagnosis not present

## 2022-07-01 NOTE — Telephone Encounter (Signed)
Inbound all from patient stating he is retuning a call.  Please advise

## 2022-07-01 NOTE — Telephone Encounter (Signed)
Spoke with pt. Documented under results notes. Pt verbalized understanding with all questions answered.    

## 2022-09-05 ENCOUNTER — Encounter: Payer: Self-pay | Admitting: Internal Medicine

## 2022-09-05 ENCOUNTER — Ambulatory Visit: Payer: BC Managed Care – PPO | Attending: Internal Medicine | Admitting: Internal Medicine

## 2022-09-05 VITALS — BP 148/80 | HR 81 | Temp 98.2°F | Ht 72.0 in | Wt 214.0 lb

## 2022-09-05 DIAGNOSIS — E782 Mixed hyperlipidemia: Secondary | ICD-10-CM

## 2022-09-05 DIAGNOSIS — E1169 Type 2 diabetes mellitus with other specified complication: Secondary | ICD-10-CM | POA: Diagnosis not present

## 2022-09-05 DIAGNOSIS — J4520 Mild intermittent asthma, uncomplicated: Secondary | ICD-10-CM

## 2022-09-05 DIAGNOSIS — K760 Fatty (change of) liver, not elsewhere classified: Secondary | ICD-10-CM | POA: Diagnosis not present

## 2022-09-05 DIAGNOSIS — E1159 Type 2 diabetes mellitus with other circulatory complications: Secondary | ICD-10-CM

## 2022-09-05 DIAGNOSIS — I152 Hypertension secondary to endocrine disorders: Secondary | ICD-10-CM

## 2022-09-05 DIAGNOSIS — D171 Benign lipomatous neoplasm of skin and subcutaneous tissue of trunk: Secondary | ICD-10-CM

## 2022-09-05 LAB — POCT GLYCOSYLATED HEMOGLOBIN (HGB A1C): HbA1c, POC (controlled diabetic range): 7.4 % — AB (ref 0.0–7.0)

## 2022-09-05 LAB — GLUCOSE, POCT (MANUAL RESULT ENTRY): POC Glucose: 141 mg/dl — AB (ref 70–99)

## 2022-09-05 MED ORDER — AMLODIPINE BESYLATE 10 MG PO TABS: 10.0000 mg | ORAL_TABLET | Freq: Every day | ORAL | 1 refills | Status: DC

## 2022-09-05 MED ORDER — VALSARTAN 80 MG PO TABS: 40.0000 mg | ORAL_TABLET | Freq: Every day | ORAL | 1 refills | Status: DC

## 2022-09-05 MED ORDER — ALBUTEROL SULFATE HFA 108 (90 BASE) MCG/ACT IN AERS: 2.0000 | INHALATION_SPRAY | Freq: Four times a day (QID) | RESPIRATORY_TRACT | 6 refills | Status: AC | PRN

## 2022-09-05 NOTE — Progress Notes (Signed)
Patient ID: Stephen Lozano, male    DOB: 1970/12/16  MRN: [id number]  CC: Diabetes (DM f/u. Med refills /No to flu vax. )   Subjective: Stephen Lozano is a 52 y.o. male who presents for chronic ds management His concerns today include:  Patient with history of HTN, mild intermittent asthma, DM type 2, HL, abn LFTs.   Abn LFT: saw GI.  Workup including liver ultrasound confirmed fatty liver.  Patient was counseled on lifestyle changes in particular healthy eating habits and regular exercise.  He is already doing both.  He states that he is cut back on eating red meat and fried foods.  Staying away from sweet snacks.  No sugary drinks.  Started eating brown rice instead of white rice.  He exercises 3 times a week.  Last A1C 6.7.  Today 7.4.  He does not check blood sugars and does not desire to do so.  He is not on any medication for diabetes and does not desire to be on anything.  Prefers to continue working with lifestyle changes.  HTN: On Norvasc 10 mg and valsartan 40 mg daily.  Takes medicines consistently.  Checks BP 2x/wk Gives rang 135-140/mid to upper 80 Limits salt No CP/SOB  Has cyst/lump on back x 10 yrs.  No change in size and not bothersome but wanted me to take a look at it.  Requests refill on albuterol.  He does not have to use it often.  Mainly during season changes and rare about to go into spring so he wanted to have refill on hand to use if needed. 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     amLODipine (NORVASC) 10 MG tablet Take 1 tablet (10 mg total) by mouth  daily. 90 tablet 1   Multiple Vitamins-Minerals (MULTIVITAMIN WITH MINERALS) tablet Take 1 tablet by mouth daily.     valsartan (DIOVAN) 40 MG tablet Take 1 tablet (40 mg total) by mouth daily. 30 tablet 5   No current facility-administered medications on file prior to visit.    Allergies  Allergen Reactions   Shellfish Allergy Anaphylaxis    Social History   Socioeconomic History   Marital status: Single    Spouse name: Not on file   Number of children: 6   Years of education: Not on file   Highest education level: Bachelor's degree (e.g., BA, AB, BS)  Occupational History   Occupation: Ship broker  Tobacco Use   Smoking status: Never   Smokeless tobacco: Never  Vaping Use   Vaping Use: Some days   Devices: only when drinking  Substance and Sexual Activity   Alcohol use: Yes    Comment: occasional   Drug use: Never   Sexual activity: Yes  Other Topics Concern   Not on file  Social History Narrative   Not on file   Social Determinants of Health   Financial Resource Strain: Not on file  Food Insecurity: Not on file  Transportation Needs: Not on file  Physical Activity: Not on file  Stress: Not on file  Social Connections: Not on file  Intimate Partner Violence: Not on file    Family History  Problem Relation Age of Onset   Diabetes Mother    Diabetes Father    Hypertension Maternal Aunt    Hypertension Maternal Uncle    Colon cancer Neg Hx    Esophageal cancer Neg Hx     Past Surgical History:  Procedure Laterality Date   APPENDECTOMY     INGUINAL HERNIA REPAIR Right     ROS: Review of Systems Negative except as stated above  PHYSICAL EXAM: BP (!) 149/87 (BP Location: Left Arm, Patient Position: Sitting, Cuff Size: Normal)   Pulse 81   Temp 98.2 F (36.8 C) (Oral)   Ht 6' (1.829 m)   Wt 214 lb (97.1 kg)   SpO2 97%   BMI 29.02 kg/m   Physical Exam   General appearance - alert, well appearing, and in no distress Mental status -  normal mood, behavior, speech, dress, motor activity, and thought processes Neck - supple, no significant adenopathy Chest - clear to auscultation, no wheezes, rales or rhonchi, symmetric air entry Heart - normal rate, regular rhythm, normal S1, S2, no murmurs, rubs, clicks or gallops Extremities - peripheral pulses normal, no pedal edema, no clubbing or cyanosis Skin -patient has a 6 x 6 cm soft raised movable mass on the posterior thorax medial to the lower aspect of the left scapular.  It is not tender to touch.  No erythema.     Latest Ref Rng & Units 06/20/2022    3:38 PM 05/02/2022    4:41 PM 02/04/2022    3:12 PM  CMP  Glucose 70 - 99 mg/dL 104     BUN 6 - 23 mg/dL 13     Creatinine 0.40 - 1.50 mg/dL 0.88     Sodium 135 - 145 mEq/L 136     Potassium 3.5 - 5.1 mEq/L 3.6     Chloride 96 - 112 mEq/L 102     CO2 19 - 32 mEq/L 25     Calcium 8.4 - 10.5 mg/dL 9.4     Total Protein 6.0 - 8.3 g/dL 8.2  7.6  7.4   Total Bilirubin 0.2 - 1.2 mg/dL 1.0  0.6  0.7   Alkaline Phos 39 - 117 U/L 65  69  58   AST 0 - 37 U/L 120  151  48   ALT 0 - 53 U/L 144  150  49    Lipid Panel     Component Value Date/Time   CHOL 228 (H) 05/02/2022 1641   TRIG 449 (H) 05/02/2022 1641   HDL 42 05/02/2022 1641   CHOLHDL 5.4 (H) 05/02/2022 1641   LDLCALC 109 (H) 05/02/2022 1641    CBC    Component Value Date/Time   WBC 7.0 06/20/2022 1538   RBC 4.27 06/20/2022 1538   HGB 13.9 06/20/2022 1538   HCT 39.6 06/20/2022 1538   PLT 238.0 06/20/2022 1538   MCV 92.8 06/20/2022 1538   MCH 32.5 11/14/2021 1311   MCHC 35.2 06/20/2022 1538   RDW 12.6 06/20/2022 1538   LYMPHSABS 2.1 06/20/2022 1538   MONOABS 0.7 06/20/2022 1538   EOSABS 0.2 06/20/2022 1538   BASOSABS 0.1 06/20/2022 1538    ASSESSMENT AND PLAN: 1. DM type 2 with diabetic mixed hyperlipidemia (HCC) A1c increased above goal compared to last visit. Patient is very much still motivated to try get and keep his diabetes under control through  healthy eating habits and regular exercise.  He declines starting any medication at this time. - POCT glycosylated hemoglobin (Hb A1C) - POCT glucose (manual entry)  2. Hypertension associated with type 2 diabetes mellitus ([place]) Not at goal.  Recommend increasing valsartan to 80 mg daily.  Last BMP was done in December. - valsartan (DIOVAN) 80 MG tablet; Take 0.5 tablets (40 mg total) by mouth daily.  Dispense: 90 tablet; Refill: 1 - amLODipine (NORVASC) 10 MG tablet; Take 1 tablet (10 mg total) by mouth daily.  Dispense: 90 tablet; Refill: 1  3. Fatty liver Discussed importance of healthy eating habits, regular exercise and weight loss to try to help reverse this.  Patient is motivated to do so.  4. Mild intermittent asthma without complication - albuterol (VENTOLIN HFA) 108 (90 Base) MCG/ACT inhaler; Inhale 2 puffs into the lungs every 6 (six) hours as needed for wheezing or shortness of breath.  Dispense: 18 g; Refill: 6  5. Lipoma of torso Inform patient that this looks like a lipoma.  Since it is not bothersome to him and has not changed in size, I recommend that we keep an eye on it and make a referral to have it removed only if it changes in size or becomes bothersome to him.     Patient was given the opportunity to ask questions.  Patient verbalized understanding of the plan and was able to repeat key elements of the plan.   This documentation was completed using Radio producer.  Any transcriptional errors are unintentional.  Orders Placed This Encounter  Procedures   POCT glycosylated hemoglobin (Hb A1C)   POCT glucose (manual entry)     Requested Prescriptions    No prescriptions requested or ordered in this encounter    No follow-ups on file.  Karle Plumber, MD, FACP

## 2022-09-05 NOTE — Patient Instructions (Signed)
Increase Valsartan to 80 mg daily. Continue healthy eating habits and regular exercise.

## 2022-09-12 ENCOUNTER — Emergency Department (HOSPITAL_COMMUNITY): Payer: BC Managed Care – PPO

## 2022-09-12 ENCOUNTER — Emergency Department (HOSPITAL_COMMUNITY)
Admission: EM | Admit: 2022-09-12 | Discharge: 2022-09-12 | Disposition: A | Discharge status: Home / Self Care | Payer: BC Managed Care – PPO | Attending: Emergency Medicine | Admitting: Emergency Medicine

## 2022-09-12 ENCOUNTER — Encounter (HOSPITAL_COMMUNITY): Payer: Self-pay

## 2022-09-12 ENCOUNTER — Other Ambulatory Visit: Payer: Self-pay

## 2022-09-12 ENCOUNTER — Ambulatory Visit (HOSPITAL_COMMUNITY)
Admission: EM | Admit: 2022-09-12 | Discharge: 2022-09-12 | Disposition: A | Discharge status: Other Acute Inpatient Hospital | Payer: BC Managed Care – PPO | Attending: Physician Assistant | Admitting: Physician Assistant

## 2022-09-12 DIAGNOSIS — I10 Essential (primary) hypertension: Secondary | ICD-10-CM | POA: Diagnosis not present

## 2022-09-12 DIAGNOSIS — R072 Precordial pain: Secondary | ICD-10-CM

## 2022-09-12 DIAGNOSIS — Z79899 Other long term (current) drug therapy: Secondary | ICD-10-CM | POA: Diagnosis not present

## 2022-09-12 DIAGNOSIS — R079 Chest pain, unspecified: Secondary | ICD-10-CM | POA: Diagnosis not present

## 2022-09-12 LAB — BASIC METABOLIC PANEL
Anion gap: 14 (ref 5–15)
BUN: 11 mg/dL (ref 6–20)
CO2: 19 mmol/L — ABNORMAL LOW (ref 22–32)
Calcium: 9.1 mg/dL (ref 8.9–10.3)
Chloride: 104 mmol/L (ref 98–111)
Creatinine, Ser: 0.95 mg/dL (ref 0.61–1.24)
GFR, Estimated: >60 mL/min (ref >60)
Glucose, Bld: 138 mg/dL — ABNORMAL HIGH (ref 70–99)
Potassium: 3.8 mmol/L (ref 3.5–5.1)
Sodium: 137 mmol/L (ref 135–145)

## 2022-09-12 LAB — CBC
HCT: 43 % (ref 39.0–52.0)
Hemoglobin: 14.5 g/dL (ref 13.0–17.0)
MCH: 31.5 pg (ref 26.0–34.0)
MCHC: 33.7 g/dL (ref 30.0–36.0)
MCV: 93.5 fL (ref 80.0–100.0)
Platelets: 238 10*3/uL (ref 150–400)
RBC: 4.6 MIL/uL (ref 4.22–5.81)
RDW: 11.8 % (ref 11.5–15.5)
WBC: 4.5 10*3/uL (ref 4.0–10.5)
nRBC: 0 % (ref 0.0–0.2)

## 2022-09-12 LAB — TROPONIN I (HIGH SENSITIVITY)
Troponin I (High Sensitivity): 8 ng/L (ref <18)
Troponin I (High Sensitivity): 8 ng/L (ref <18)

## 2022-09-12 MED ORDER — ACETAMINOPHEN 500 MG PO TABS
1000.0000 mg | ORAL_TABLET | Freq: Once | ORAL | Status: AC
  Administered 2022-09-12: 1000 mg via ORAL
  Filled 2022-09-12: qty 2

## 2022-09-12 MED ORDER — IBUPROFEN 400 MG PO TABS
400.0000 mg | ORAL_TABLET | Freq: Once | ORAL | Status: AC
  Administered 2022-09-12: 400 mg via ORAL
  Filled 2022-09-12: qty 1

## 2022-09-12 NOTE — ED Provider Notes (Signed)
I was called into triage to evaluate patient by nursing staff.  Reports a 24-hour history of worsening chest pain.  Pain is rated 8 on a 0-10 pain scale, described as pressure/sharp, no aggravating or alleviating factors identified.  He reports a history of what sounds like pericarditis and does report a worsening of pain with laying back and improvement with leaning forward.  Denies associated shortness of breath, nausea, vomiting.  He does have a history of hypertension and reports elevated cholesterol as well as prediabetes.  He does not smoke.  Denies any family history of cardiovascular disease.  EKG was obtained that showed normal sinus rhythm with ventricular rate of 72 bpm with no significant change from 02/19/2021 tracing.  Discussed that given his risk factors and active chest pain recommended he go to the emergency room.  Patient is agreeable and will go directly to [facility], ER.  He was stable at the time of discharge.   Terrilee Croak, PA-C 09/12/22 [phone]

## 2022-09-12 NOTE — ED Triage Notes (Signed)
Pt presents to ED from UC for eval of L sided non-radiating chest pain that he states has been constant since yesterday. Denies sob, dizziness, weakness, or other associated symptoms.

## 2022-09-12 NOTE — ED Notes (Signed)
Patient verbalizes understanding of discharge instructions. Opportunity for questioning and answers were provided. Pt discharged from ED. 

## 2022-09-12 NOTE — ED Notes (Signed)
Patient transported to X-ray 

## 2022-09-12 NOTE — Discharge Instructions (Signed)
It was our pleasure to provide your ER care today - we hope that you feel better.  For chest pain, follow up closely with cardiologist in the next 1-2 weeks.   Return to ER if worse, new symptoms, recurrent or persistent chest pain, increased trouble breathing, or other concern.

## 2022-09-12 NOTE — ED Triage Notes (Signed)
Pt presents to the Urgent Care for chest pain that started yesterday. Pt states chest pain comes and goes. Pt stated he has history of high blood pressure. Pain is 8/10.

## 2022-09-12 NOTE — ED Provider Notes (Signed)
[facility] Provider Note   CSN: [id number] Arrival date & time: 09/12/22  [id number]     History  Chief Complaint  Patient presents with   Chest Pain    Stephen Lozano is a 52 y.o. male.  Pt with c/o mid to left chest pain in past day. Symptoms at rest,  constant, dull, moderate, non radiating, not pleuritic. No change whether upright or supine. No change exertion, and no recent exertional chest pain or unusual doe or sob. No associated sob, nv or diaphoresis. No cough or uri symptoms. No fever or chills. No chest wall injury or strain. No heartburn. Denies personal or fam hx cad. No recent viral/febrile illness. No hx dvt or pe. No recent surgery, immobility or trauma. No leg pain or swelling.   The history is provided by the patient and medical records.  Chest Pain Associated symptoms: no abdominal pain, no back pain, no cough, no fever, no headache, no nausea, no palpitations, no shortness of breath and no vomiting        Home Medications Prior to Admission medications   Medication Sig Start Date End Date Taking? Authorizing Provider  albuterol (VENTOLIN HFA) 108 (90 Base) MCG/ACT inhaler Inhale 2 puffs into the lungs every 6 (six) hours as needed for wheezing or shortness of breath. 09/05/22   Ladell Pier, MD  amLODipine (NORVASC) 10 MG tablet Take 1 tablet (10 mg total) by mouth daily. 09/05/22   Ladell Pier, MD  Multiple Vitamins-Minerals (MULTIVITAMIN WITH MINERALS) tablet Take 1 tablet by mouth daily.    [provider]  valsartan (DIOVAN) 80 MG tablet Take 0.5 tablets (40 mg total) by mouth daily. 09/05/22   Ladell Pier, MD      Allergies    Shellfish allergy    Review of Systems   Review of Systems  Constitutional:  Negative for fever.  HENT:  Negative for sore throat.   Eyes:  Negative for redness.  Respiratory:  Negative for cough and shortness of breath.   Cardiovascular:  Positive for chest  pain. Negative for palpitations and leg swelling.  Gastrointestinal:  Negative for abdominal pain, nausea and vomiting.  Genitourinary:  Negative for flank pain.  Musculoskeletal:  Negative for back pain and neck pain.  Skin:  Negative for rash.  Neurological:  Negative for headaches.  Hematological:  Does not bruise/bleed easily.  Psychiatric/Behavioral:  Negative for confusion.     Physical Exam Updated Vital Signs BP (!) 150/94   Pulse 67   Temp 98.1 F (36.7 C) (Oral)   Resp 15   SpO2 96%  Physical Exam Vitals and nursing note reviewed.  Constitutional:      Appearance: Normal appearance. He is well-developed.  HENT:     Head: Atraumatic.     Nose: Nose normal.     Mouth/Throat:     Mouth: Mucous membranes are moist.     Pharynx: Oropharynx is clear.  Eyes:     General: No scleral icterus.    Conjunctiva/sclera: Conjunctivae normal.  Neck:     Trachea: No tracheal deviation.  Cardiovascular:     Rate and Rhythm: Normal rate and regular rhythm.     Pulses: Normal pulses.     Heart sounds: Normal heart sounds. No murmur heard.    No friction rub. No gallop.  Pulmonary:     Effort: Pulmonary effort is normal. No accessory muscle usage or respiratory distress.     Breath sounds:  Normal breath sounds.     Comments: Mild left chest wall tenderness. No sts. Normal chest movement.  Chest:     Chest wall: Tenderness present.  Abdominal:     General: Bowel sounds are normal. There is no distension.     Palpations: Abdomen is soft.     Tenderness: There is no abdominal tenderness. There is no guarding.  Musculoskeletal:        General: No swelling or tenderness.     Cervical back: Normal range of motion and neck supple. No rigidity.     Right lower leg: No edema.     Left lower leg: No edema.  Skin:    General: Skin is warm and dry.     Findings: No rash.  Neurological:     Mental Status: He is alert.     Comments: Alert, speech clear.   Psychiatric:        Mood  and Affect: Mood normal.     ED Results / Procedures / Treatments   Labs (all labs ordered are listed, but only abnormal results are displayed) Results for orders placed or performed during the hospital encounter of [id number]  Basic metabolic panel  Result Value Ref Range   Sodium 137 135 - 145 mmol/L   Potassium 3.8 3.5 - 5.1 mmol/L   Chloride 104 98 - 111 mmol/L   CO2 19 (L) 22 - 32 mmol/L   Glucose, Bld 138 (H) 70 - 99 mg/dL   BUN 11 6 - 20 mg/dL   Creatinine, Ser 0.95 0.61 - 1.24 mg/dL   Calcium 9.1 8.9 - 10.3 mg/dL   GFR, Estimated >60 >60 mL/min   Anion gap 14 5 - 15  CBC  Result Value Ref Range   WBC 4.5 4.0 - 10.5 K/uL   RBC 4.60 4.22 - 5.81 MIL/uL   Hemoglobin 14.5 13.0 - 17.0 g/dL   HCT 43.0 39.0 - 52.0 %   MCV 93.5 80.0 - 100.0 fL   MCH 31.5 26.0 - 34.0 pg   MCHC 33.7 30.0 - 36.0 g/dL   RDW 11.8 11.5 - 15.5 %   Platelets 238 150 - 400 K/uL   nRBC 0.0 0.0 - 0.2 %  Troponin I (High Sensitivity)  Result Value Ref Range   Troponin I (High Sensitivity) 8 <18 ng/L  Troponin I (High Sensitivity)  Result Value Ref Range   Troponin I (High Sensitivity) 8 <18 ng/L      EKG EKG Interpretation  Date/Time:  Friday September 12 2022 09:22:34 EST Ventricular Rate:  68 PR Interval:  173 QRS Duration: 122 QT Interval:  405 QTC Calculation: 431 R Axis:   -10 Text Interpretation: Sinus rhythm Non-specific intra-ventricular conduction delay Nonspecific ST abnormality Confirmed by Lajean Saver [phone]) on 09/12/2022 9:24:32 AM  Radiology DG Chest 2 View  Result Date: 09/12/2022 CLINICAL DATA:  Chest pain EXAM: CHEST - 2 VIEW COMPARISON:  02/19/2021 FINDINGS: Frontal and lateral views of the chest demonstrate an unremarkable cardiac silhouette. No airspace disease, effusion, or pneumothorax. No acute bony abnormalities. IMPRESSION: 1. No acute intrathoracic process. Electronically Signed   By: Randa Ngo M.D.   On: 09/12/2022 10:16    Procedures Procedures     Medications Ordered in ED Medications  acetaminophen (TYLENOL) tablet 1,000 mg (1,000 mg Oral Given 09/12/22 1017)  ibuprofen (ADVIL) tablet 400 mg (400 mg Oral Given 09/12/22 1017)    ED Course/ Medical Decision Making/ A&P  Medical Decision Making Problems Addressed: Essential hypertension: chronic illness or injury with exacerbation, progression, or side effects of treatment that poses a threat to life or bodily functions Precordial chest pain: acute illness or injury with systemic symptoms that poses a threat to life or bodily functions  Amount and/or Complexity of Data Reviewed External Data Reviewed: notes. Labs: ordered. Decision-making details documented in ED Course. Radiology: ordered and independent interpretation performed. Decision-making details documented in ED Course. ECG/medicine tests: ordered and independent interpretation performed. Decision-making details documented in ED Course.  Risk OTC drugs. Prescription drug management. Decision regarding hospitalization.   Iv ns. Continuous pulse ox and cardiac monitoring. Labs ordered/sent. Imaging ordered.   Differential diagnosis includes acs, msk cp, gi cp, etc . Dispo decision including potential need for admission considered - will get labs and imaging and reassess.   Reviewed nursing notes and prior charts for additional history. External reports reviewed.  Cardiac monitor: sinus rhythm, rate 68.  Labs reviewed/interpreted by me - initial trop normal.   Xrays reviewed/interpreted by me - no pna.   Additional labs reviewed/interpreted by me - delta trop normal and not increasing - after symptoms present for past day, felt not c/w acs.   Pt currently comfortable appearing, hr 60s, rr 14, pulse ox 98%.  Pt currently appears stable for d/c.   Cardiology f/u/referral provided.   Rec close pcp f/u.  Return precautions provided.          Final Clinical Impression(s) / ED  Diagnoses Final diagnoses:  Precordial chest pain  Essential hypertension    Rx / DC Orders ED Discharge Orders          Ordered    Ambulatory referral to Cardiology       Comments: If you have not heard from the Cardiology office within the next 72 hours please call [phone].   09/12/22 1045              Lajean Saver, MD 09/12/22 1257

## 2022-09-22 NOTE — Progress Notes (Unsigned)
Office Visit    Patient Name: Stephen Lozano Date of Encounter: 09/23/2022  PCP:  Ladell Pier, MD   [facility]  Cardiologist:  Freada Bergeron, MD  Advanced Practice Provider:  No care team member to display Electrophysiologist:  None   HPI    Stephen Lozano is a 52 y.o. 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  Reviewing the patient's history, was seen in the ED on 10/13 with 3 days of left arm numbness as well as intermittent chest pain.  EKG showed normal sinus rhythm with incomplete RBBB.  LVH with borderline left axis.  Troponin 9>> 1.  Was instructed to follow-up with cardiology for further management.  Patient was seen by Dr. Johney Frame 05/10/2020 and at that time 2 to 3 weeks prior he woke up having intermittent chest pain and tingling sensation in his left arm.  Thought he might of slept on it wrong.  He went to work and tried to push through but similar episode with leg pain occurred the next day.  This prompted him to go to the ED as above.  At the time of his last appointment, he was very active.  He is on his feet a lot of the time walking 5 miles per day and biking 2 miles per day.  No exertional symptoms.  No chest pain, shortness of breath, lightheadedness, nausea, orthopnea, or PND.  He had had a treadmill stress test in the past which was reportedly normal.  Had not had recurrent chest pain since that 1 episode.  He was seen in the ED 09/12/2022 for complaint of mid to left chest pain for the past day.  Symptoms were at rest, consistent, dull, moderate, nonradiating, nonpleuritic.  No change in pain with change of position.  No associated shortness of breath, N/V, or diaphoresis.  No cough or URI symptoms.  Today, he tells me that he has sharp pain in his chest.  He has been dealing  with " irritated heart syndrome" for years.  But this feels very similar.  He tells me he has never been on colchicine for this.  Otherwise, he has been doing well from a cardiac standpoint.  We discussed updating an echocardiogram for further workup.  Reports no shortness of breath nor dyspnea on exertion. Reports no chest pain, pressure, or tightness. No edema, orthopnea, PND. Reports no palpitations.   Past Medical History    Past Medical History:  Diagnosis Date   Asthma    Past Surgical History:  Procedure Laterality Date   APPENDECTOMY     INGUINAL HERNIA REPAIR Right     Allergies  Allergies  Allergen Reactions   Shellfish Allergy Anaphylaxis     EKGs/Labs/Other Studies Reviewed:   The following studies were reviewed today: Echocardiogram 06/27/2020 IMPRESSIONS     1. Left ventricular ejection fraction, by estimation, is 60 to 65%. The  left ventricle has normal function.   2. The left ventricle has no regional wall motion abnormalities.   3. There is mild concentric left ventricular hypertrophy.   4. Left ventricular diastolic parameters were normal.   5. Right ventricular systolic function is normal. The right ventricular  size is normal. There is normal pulmonary artery systolic pressure.   6. The mitral valve is normal in structure. Trivial mitral valve  regurgitation. No  evidence of mitral stenosis.   7. The aortic valve is tricuspid. Aortic valve regurgitation is not  visualized.   8. The inferior vena cava is normal in size with greater than 50%  respiratory variability, suggesting right atrial pressure of 3 mmHg.   Comparison(s): No prior Echocardiogram.   FINDINGS   Left Ventricle: Left ventricular ejection fraction, by estimation, is 60  to 65%. The left ventricle has normal function. The left ventricle has no  regional wall motion abnormalities. The left ventricular internal cavity  size was normal in size. There is   mild concentric left  ventricular hypertrophy. Left ventricular diastolic  parameters were normal.   Right Ventricle: The right ventricular size is normal. No increase in  right ventricular wall thickness. Right ventricular systolic function is  normal. There is normal pulmonary artery systolic pressure. The tricuspid  regurgitant velocity is 2.40 m/s, and   with an assumed right atrial pressure of 3 mmHg, the estimated right  ventricular systolic pressure is [id number] mmHg.   Left Atrium: Left atrial size was normal in size.   Right Atrium: Right atrial size was normal in size.   Pericardium: There is no evidence of pericardial effusion.   Mitral Valve: The mitral valve is normal in structure. Trivial mitral  valve regurgitation. No evidence of mitral valve stenosis.   Tricuspid Valve: The tricuspid valve is normal in structure. Tricuspid  valve regurgitation is mild.   Aortic Valve: The aortic valve is tricuspid. Aortic valve regurgitation is  not visualized.   Pulmonic Valve: The pulmonic valve was normal in structure. Pulmonic valve  regurgitation is mild.   Aorta: The aortic root and ascending aorta are structurally normal, with  no evidence of dilitation.   Venous: The inferior vena cava is normal in size with greater than 50%  respiratory variability, suggesting right atrial pressure of 3 mmHg.   IAS/Shunts: No atrial level shunt detected by color flow Doppler.  EKG:  EKG is not ordered today.   Recent Labs: 06/20/2022: ALT 144 09/12/2022: BUN 11; Creatinine, Ser 0.95; Hemoglobin 14.5; Platelets 238; Potassium 3.8; Sodium 137  Recent Lipid Panel    Component Value Date/Time   CHOL 228 (H) 05/02/2022 1641   TRIG 449 (H) 05/02/2022 1641   HDL 42 05/02/2022 1641   CHOLHDL 5.4 (H) 05/02/2022 1641   LDLCALC 109 (H) 05/02/2022 1641   Home Medications   Current Meds  Medication Sig   albuterol (VENTOLIN HFA) 108 (90 Base) MCG/ACT inhaler Inhale 2 puffs into the lungs every 6 (six) hours as  needed for wheezing or shortness of breath.   amLODipine (NORVASC) 10 MG tablet Take 1 tablet (10 mg total) by mouth daily.   colchicine 0.6 MG tablet Take 1 tablet (0.6 mg total) by mouth daily.   Multiple Vitamins-Minerals (MULTIVITAMIN WITH MINERALS) tablet Take 1 tablet by mouth daily.   valsartan (DIOVAN) 80 MG tablet Take 80 mg by mouth daily.   [DISCONTINUED] valsartan (DIOVAN) 80 MG tablet Take 0.5 tablets (40 mg total) by mouth daily.     Review of Systems      All other systems reviewed and are otherwise negative except as noted above.  Physical Exam    VS:  BP (!) 148/72   Pulse 85   Ht 6' (1.829 m)   Wt 218 lb 6.4 oz (99.1 kg)   SpO2 95%   BMI 29.62 kg/m  , BMI Body mass index is 29.62 kg/m.  Wt Readings from Last 3 Encounters:  09/23/22 218 lb 6.4 oz (99.1 kg)  09/05/22 214 lb (97.1 kg)  06/20/22 219 lb (99.3 kg)     GEN: Well nourished, well developed, in no acute distress. HEENT: normal. Neck: Supple, no JVD, carotid bruits, or masses. Cardiac: RRR, no murmurs, rubs, or gallops. No clubbing, cyanosis, edema.  Radials/PT 2+ and equal bilaterally.  Respiratory:  Respirations regular and unlabored, clear to auscultation bilaterally. GI: Soft, nontender, nondistended. MS: No deformity or atrophy. Skin: Warm and dry, no rash. Neuro:  Strength and sensation are intact. Psych: Normal affect.  Assessment & Plan    Precordial chest pain/Recurrent pericarditis -We ordered an echocardiogram today -Continue current medications including amlodipine 10 mg daily and valsartan 80 mg daily -Start on colchicine 0.6 mg daily and get a C-reactive protein and sed rate  Essential hypertension -continue to monitor at home -usually it is much lower per patient. -Continue current medications at this time -Maintain a low-sodium diet    Disposition: Follow up 6 weeks  with Freada Bergeron, MD or APP.  Signed, Elgie Collard, PA-C 09/23/2022, 2:24 PM [place]

## 2022-09-23 ENCOUNTER — Ambulatory Visit: Payer: BC Managed Care – PPO | Attending: Physician Assistant | Admitting: Physician Assistant

## 2022-09-23 ENCOUNTER — Encounter: Payer: Self-pay | Admitting: Physician Assistant

## 2022-09-23 VITALS — BP 148/72 | HR 85 | Ht 72.0 in | Wt 218.4 lb

## 2022-09-23 DIAGNOSIS — I300 Acute nonspecific idiopathic pericarditis: Secondary | ICD-10-CM | POA: Diagnosis not present

## 2022-09-23 DIAGNOSIS — I10 Essential (primary) hypertension: Secondary | ICD-10-CM

## 2022-09-23 DIAGNOSIS — R072 Precordial pain: Secondary | ICD-10-CM

## 2022-09-23 MED ORDER — COLCHICINE 0.6 MG PO TABS: 0.6000 mg | ORAL_TABLET | Freq: Every day | ORAL | 2 refills | Status: DC

## 2022-09-23 NOTE — Patient Instructions (Addendum)
Medication Instructions:   START TAKING : COLCHICINE 0.6 MG ONCE A DAY   *If you need a refill on your cardiac medications before your next appointment, please call your pharmacy*   Lab Work: NONE ORDERED  TODAY    If you have labs (blood work) drawn today and your tests are completely normal, you will receive your results only by: [facility] (if you have MyChart) OR A paper copy in the mail If you have any lab test that is abnormal or we need to change your treatment, we will call you to review the results.   Testing/Procedures: Your physician has requested that you have an echocardiogram. Echocardiography is a painless test that uses sound waves to create images of your heart. It provides your doctor with information about the size and shape of your heart and how well your heart's chambers and valves are working. This procedure takes approximately one hour. There are no restrictions for this procedure. Please do NOT wear cologne, perfume, aftershave, or lotions (deodorant is allowed). Please arrive 15 minutes prior to your appointment time.   Follow-Up: At [facility], you and your health needs are our priority.  As part of our continuing mission to provide you with exceptional heart care, we have created designated Provider Care Teams.  These Care Teams include your primary Cardiologist (physician) and Advanced Practice Providers (APPs -  Physician Assistants and Nurse Practitioners) who all work together to provide you with the care you need, when you need it.  We recommend signing up for the patient portal called "MyChart".  Sign up information is provided on this After Visit Summary.  MyChart is used to connect with patients for Virtual Visits (Telemedicine).  Patients are able to view lab/test results, encounter notes, upcoming appointments, etc.  Non-urgent messages can be sent to your provider as well.   To learn more about what you can do with MyChart, go to  [email or web address].    Your next appointment:   6 week(s)  Provider:   Freada Bergeron, MD  / [facility]   Other Instructions  KEEP A DAILY LOG OF BLOOD PRESSURES  FOR ONE TO TWO WEEKS

## 2022-09-24 LAB — SEDIMENTATION RATE: Sed Rate: 29 mm/hr (ref 0–30)

## 2022-09-24 LAB — C-REACTIVE PROTEIN: CRP: 3 mg/L (ref 0–10)

## 2022-10-15 ENCOUNTER — Other Ambulatory Visit: Payer: Self-pay

## 2022-10-15 ENCOUNTER — Other Ambulatory Visit (INDEPENDENT_AMBULATORY_CARE_PROVIDER_SITE_OTHER): Payer: BC Managed Care – PPO

## 2022-10-15 ENCOUNTER — Telehealth: Payer: Self-pay

## 2022-10-15 DIAGNOSIS — K760 Fatty (change of) liver, not elsewhere classified: Secondary | ICD-10-CM

## 2022-10-15 DIAGNOSIS — R7989 Other specified abnormal findings of blood chemistry: Secondary | ICD-10-CM

## 2022-10-15 LAB — HEPATIC FUNCTION PANEL
ALT: 163 U/L — ABNORMAL HIGH (ref 0–53)
AST: 135 U/L — ABNORMAL HIGH (ref 0–37)
Albumin: 4.7 g/dL (ref 3.5–5.2)
Alkaline Phosphatase: 60 U/L (ref 39–117)
Bilirubin, Direct: 0.1 mg/dL (ref 0.0–0.3)
Total Bilirubin: 0.8 mg/dL (ref 0.2–1.2)
Total Protein: 8.1 g/dL (ref 6.0–8.3)

## 2022-10-15 NOTE — Telephone Encounter (Signed)
Message Received: Today  Personal reminder Emeline Darling, RN  Emeline Darling, RN Personal reminder sent 07/16/2021  In 3 months with our lab, check hepatic function panel and iron, TIBC, ferritin per Dr. Myrtie Neither

## 2022-10-15 NOTE — Telephone Encounter (Signed)
Reminder received in Epic:  Orders for labs placed in Epic Left message for pt to call back   

## 2022-10-15 NOTE — Telephone Encounter (Signed)
Pt was made aware of Dr. Myrtie Neither recommendations: Pt made aware that the orders for the labs have been placed. Address provided. Pt verbalized understanding with all questions answered.

## 2022-10-16 LAB — IRON,TIBC AND FERRITIN PANEL
%SAT: 32 % (calc) (ref 20–48)
Ferritin: 743 ng/mL — ABNORMAL HIGH (ref 38–380)
Iron: 122 ug/dL (ref 50–180)
TIBC: 377 mcg/dL (calc) (ref 250–425)

## 2022-10-17 NOTE — Telephone Encounter (Signed)
Incoming call from patient states he is returning [name]'s call

## 2022-10-21 NOTE — Telephone Encounter (Addendum)
Patient called again to follow up on previous message. Asked that he get a call back to go over lab results.

## 2022-10-22 NOTE — Telephone Encounter (Signed)
I attempted to contact patient at this time, call went directly to voicemail. I will try again to call him tomorrow.

## 2022-10-22 NOTE — Telephone Encounter (Signed)
Patient is calling again regarding previous messages. Requesting a call back.

## 2022-10-22 NOTE — Telephone Encounter (Signed)
Colleen I see that you left a message for the pt to return your call to discuss test results. He has called back and is waiting for a return call.

## 2022-10-23 ENCOUNTER — Other Ambulatory Visit: Payer: Self-pay

## 2022-10-23 DIAGNOSIS — R7989 Other specified abnormal findings of blood chemistry: Secondary | ICD-10-CM

## 2022-10-23 NOTE — Telephone Encounter (Signed)
Dr. Myrtie Neither, I spoke with Stephen Lozano and he has not been abstinent from alcohol.  He endorsed drinking 6 glasses of wine over the past 4 weeks and he drank 1 glass of wine the night before his labs were drawn.  I instructed him to be 100% abstinent from alcohol and repeat a hepatic panel in 4 weeks. I also discussed scheduling a liver biopsy at this time to further clarify his diagnosis, however, he will think about it.  Dr. Myrtie Neither, let me know your recommendations at this point in I will contact the patient.  Thank you  [name],  keeping you in the loop since you are involved with his conversation.  Please enter hepatic panel due in 4 weeks.  Thank you.

## 2022-10-23 NOTE — Telephone Encounter (Signed)
Lab orders have been entered as ordered

## 2022-10-23 NOTE — Telephone Encounter (Signed)
Thank you for the update.  My recommendations remain a check of hepatic function panel in 4 to 6 weeks.  If he decides to remain abstinent from alcohol and LFTs remain elevated, then liver biopsy.  -HD

## 2022-10-24 ENCOUNTER — Ambulatory Visit (HOSPITAL_COMMUNITY): Payer: BC Managed Care – PPO | Attending: Cardiology

## 2022-10-24 DIAGNOSIS — R072 Precordial pain: Secondary | ICD-10-CM | POA: Diagnosis not present

## 2022-10-24 LAB — ECHOCARDIOGRAM COMPLETE
AR max vel: 4.08 cm2
AV Area VTI: 3.91 cm2
AV Area mean vel: 3.98 cm2
AV Mean grad: 3 mmHg
AV Peak grad: 6.3 mmHg
Ao pk vel: 1.25 m/s
Area-P 1/2: 2.7 cm2
S' Lateral: 2.7 cm

## 2022-10-28 ENCOUNTER — Other Ambulatory Visit: Payer: Self-pay

## 2022-10-28 DIAGNOSIS — R7989 Other specified abnormal findings of blood chemistry: Secondary | ICD-10-CM

## 2022-10-28 DIAGNOSIS — K760 Fatty (change of) liver, not elsewhere classified: Secondary | ICD-10-CM

## 2022-10-28 NOTE — Addendum Note (Signed)
Addended by: Emeline Darling on: 10/28/2022 10:17 AM   Modules accepted: Orders

## 2022-10-31 ENCOUNTER — Ambulatory Visit: Payer: BC Managed Care – PPO | Admitting: Physician Assistant

## 2022-11-07 ENCOUNTER — Ambulatory Visit: Payer: BC Managed Care – PPO | Attending: Physician Assistant | Admitting: Physician Assistant

## 2022-11-07 ENCOUNTER — Encounter: Payer: Self-pay | Admitting: Physician Assistant

## 2022-11-07 VITALS — BP 125/74 | HR 70 | Ht 72.0 in | Wt 218.0 lb

## 2022-11-07 DIAGNOSIS — R072 Precordial pain: Secondary | ICD-10-CM | POA: Diagnosis not present

## 2022-11-07 DIAGNOSIS — I300 Acute nonspecific idiopathic pericarditis: Secondary | ICD-10-CM

## 2022-11-07 DIAGNOSIS — I10 Essential (primary) hypertension: Secondary | ICD-10-CM | POA: Diagnosis not present

## 2022-11-07 NOTE — Patient Instructions (Signed)
Medication Instructions:   STOP TAKING: COLCHICINE   *If you need a refill on your cardiac medications before your next appointment, please call your pharmacy*   Lab Work: NONE ORDERED  TODAY    If you have labs (blood work) drawn today and your tests are completely normal, you will receive your results only by: MyChart Message (if you have MyChart) OR A paper copy in the mail If you have any lab test that is abnormal or we need to change your treatment, we will call you to review the results.   Testing/Procedures:NONE ORDERED  TODAY     Follow-Up: At [facility], you and your health needs are our priority.  As part of our continuing mission to provide you with exceptional heart care, we have created designated Provider Care Teams.  These Care Teams include your primary Cardiologist (physician) and Advanced Practice Providers (APPs -  Physician Assistants and Nurse Practitioners) who all work together to provide you with the care you need, when you need it.  We recommend signing up for the patient portal called "MyChart".  Sign up information is provided on this After Visit Summary.  MyChart is used to connect with patients for Virtual Visits (Telemedicine).  Patients are able to view lab/test results, encounter notes, upcoming appointments, etc.  Non-urgent messages can be sent to your provider as well.   To learn more about what you can do with MyChart, go to [email or web address].    Your next appointment:   6 month(s)  Provider:   Meriam Sprague, MD     Other Instructions

## 2022-11-07 NOTE — Progress Notes (Signed)
Office Visit    Patient Name: Stephen Lozano Date of Encounter: 11/07/2022  PCP:  Marcine Matar, MD   [facility]  Cardiologist:  Meriam Sprague, MD  Advanced Practice Provider:  No care team member to display Electrophysiologist:  None   HPI    Stephen Lozano is a 52 y.o. 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  Reviewing the patient's history, was seen in the ED on 10/13 with 3 days of left arm numbness as well as intermittent chest pain.  EKG showed normal sinus rhythm with incomplete RBBB.  LVH with borderline left axis.  Troponin 9>> 1.  Was instructed to follow-up with cardiology for further management.  Patient was seen by Dr. Shari Prows 05/10/2020 and at that time 2 to 3 weeks prior he woke up having intermittent chest pain and tingling sensation in his left arm.  Thought he might of slept on it wrong.  He went to work and tried to push through but similar episode with leg pain occurred the next day.  This prompted him to go to the ED as above.  At the time of his last appointment, he was very active.  He is on his feet a lot of the time walking 5 miles per day and biking 2 miles per day.  No exertional symptoms.  No chest pain, shortness of breath, lightheadedness, nausea, orthopnea, or PND.  He had had a treadmill stress test in the past which was reportedly normal.  Had not had recurrent chest pain since that 1 episode.  He was seen in the ED 09/12/2022 for complaint of mid to left chest pain for the past day.  Symptoms were at rest, consistent, dull, moderate, nonradiating, nonpleuritic.  No change in pain with change of position.  No associated shortness of breath, N/V, or diaphoresis.  No cough or URI symptoms.  He was seen by me 09/23/2022, he tells me that he has sharp pain in his chest.   He has been dealing with " irritated heart syndrome" for years.  But this feels very similar.  He tells me he has never been on colchicine for this.  Otherwise, he has been doing well from a cardiac standpoint.  We discussed updating an echocardiogram for further workup.  Today, he tells me that his insurance only approved for CT scan.  However, we reviewed his echocardiogram today and there is no indication at this time.  No further chest pain.  We discussed discontinuing colchicine.  He had a normal sed rate and C-reactive protein.  Otherwise, feeling well at this time.  Blood pressure monitoring is going well at home and has been well-controlled on valsartan and amlodipine.  Continue current medications as prescribed.  Reports no shortness of breath nor dyspnea on exertion. Reports no chest pain, pressure, or tightness. No edema, orthopnea, PND. Reports no palpitations.   Past Medical History    Past Medical History:  Diagnosis Date   Asthma    Past Surgical History:  Procedure Laterality Date   APPENDECTOMY     INGUINAL HERNIA REPAIR Right     Allergies  Allergies  Allergen Reactions   Shellfish Allergy Anaphylaxis     EKGs/Labs/Other Studies Reviewed:   The following studies were reviewed today: Echocardiogram 06/27/2020 IMPRESSIONS     1. Left ventricular ejection fraction,  by estimation, is 60 to 65%. The  left ventricle has normal function.   2. The left ventricle has no regional wall motion abnormalities.   3. There is mild concentric left ventricular hypertrophy.   4. Left ventricular diastolic parameters were normal.   5. Right ventricular systolic function is normal. The right ventricular  size is normal. There is normal pulmonary artery systolic pressure.   6. The mitral valve is normal in structure. Trivial mitral valve  regurgitation. No evidence of mitral stenosis.   7. The aortic valve is tricuspid. Aortic valve regurgitation is not  visualized.   8. The  inferior vena cava is normal in size with greater than 50%  respiratory variability, suggesting right atrial pressure of 3 mmHg.   Comparison(s): No prior Echocardiogram.   FINDINGS   Left Ventricle: Left ventricular ejection fraction, by estimation, is 60  to 65%. The left ventricle has normal function. The left ventricle has no  regional wall motion abnormalities. The left ventricular internal cavity  size was normal in size. There is   mild concentric left ventricular hypertrophy. Left ventricular diastolic  parameters were normal.   Right Ventricle: The right ventricular size is normal. No increase in  right ventricular wall thickness. Right ventricular systolic function is  normal. There is normal pulmonary artery systolic pressure. The tricuspid  regurgitant velocity is 2.40 m/s, and   with an assumed right atrial pressure of 3 mmHg, the estimated right  ventricular systolic pressure is 26.0 mmHg.   Left Atrium: Left atrial size was normal in size.   Right Atrium: Right atrial size was normal in size.   Pericardium: There is no evidence of pericardial effusion.   Mitral Valve: The mitral valve is normal in structure. Trivial mitral  valve regurgitation. No evidence of mitral valve stenosis.   Tricuspid Valve: The tricuspid valve is normal in structure. Tricuspid  valve regurgitation is mild.   Aortic Valve: The aortic valve is tricuspid. Aortic valve regurgitation is  not visualized.   Pulmonic Valve: The pulmonic valve was normal in structure. Pulmonic valve  regurgitation is mild.   Aorta: The aortic root and ascending aorta are structurally normal, with  no evidence of dilitation.   Venous: The inferior vena cava is normal in size with greater than 50%  respiratory variability, suggesting right atrial pressure of 3 mmHg.   IAS/Shunts: No atrial level shunt detected by color flow Doppler.  EKG:  EKG is not ordered today.   Recent Labs: 09/12/2022: BUN 11;  Creatinine, Ser 0.95; Hemoglobin 14.5; Platelets 238; Potassium 3.8; Sodium 137 10/15/2022: ALT 163  Recent Lipid Panel    Component Value Date/Time   CHOL 228 (H) 05/02/2022 1641   TRIG 449 (H) 05/02/2022 1641   HDL 42 05/02/2022 1641   CHOLHDL 5.4 (H) 05/02/2022 1641   LDLCALC 109 (H) 05/02/2022 1641   Home Medications   No outpatient medications have been marked as taking for the 11/07/22 encounter (Appointment) with Sharlene Dory, PA-C.     Review of Systems      All other systems reviewed and are otherwise negative except as noted above.  Physical Exam    VS:  There were no vitals taken for this visit. , BMI There is no height or weight on file to calculate BMI.  Wt Readings from Last 3 Encounters:  09/23/22 218 lb 6.4 oz (99.1 kg)  09/05/22 214 lb (97.1 kg)  06/20/22 219 lb (99.3 kg)     GEN: Well  nourished, well developed, in no acute distress. HEENT: normal. Neck: Supple, no JVD, carotid bruits, or masses. Cardiac: RRR, no murmurs, rubs, or gallops. No clubbing, cyanosis, edema.  Radials/PT 2+ and equal bilaterally.  Respiratory:  Respirations regular and unlabored, clear to auscultation bilaterally. GI: Soft, nontender, nondistended. MS: No deformity or atrophy. Skin: Warm and dry, no rash. Neuro:  Strength and sensation are intact. Psych: Normal affect.  Assessment & Plan    Precordial chest pain/Recurrent pericarditis -reviewed echo and inflammatory labs -Continue current medications including amlodipine 10 mg daily and valsartan 80 mg daily -Stop colchicine 0.6 mg daily  Essential hypertension -continue to monitor at home-132/79 -continue valsartan and norvasc -Maintain a low-sodium diet    Disposition: Follow up 6 months  with Meriam Sprague, MD or APP.  Signed, Sharlene Dory, PA-C 11/07/2022, 8:48 AM [facility]

## 2022-12-03 ENCOUNTER — Telehealth: Payer: Self-pay | Admitting: Nurse Practitioner

## 2022-12-03 ENCOUNTER — Other Ambulatory Visit: Payer: Self-pay | Admitting: Internal Medicine

## 2022-12-03 DIAGNOSIS — R7989 Other specified abnormal findings of blood chemistry: Secondary | ICD-10-CM

## 2022-12-03 DIAGNOSIS — I152 Hypertension secondary to endocrine disorders: Secondary | ICD-10-CM

## 2022-12-03 NOTE — Telephone Encounter (Signed)
Contacted pt & LVM to return call 

## 2022-12-03 NOTE — Addendum Note (Signed)
Addended by: Rise Paganini on: 12/03/2022 10:07 AM   Modules accepted: Orders

## 2022-12-03 NOTE — Telephone Encounter (Signed)
DD, pls contact patient and kindly remind him to return to our lab to have a hepatic panel and hemochromatosis mutation levels done. I updated the correct lab orders at this time. Please cancel the prior lab order dated 4/23. THX.

## 2022-12-04 NOTE — Telephone Encounter (Signed)
Requested Prescriptions  Pending Prescriptions Disp Refills   valsartan (DIOVAN) 40 MG tablet [Pharmacy Med Name: VALSARTAN 40 MG TABLET] 90 tablet 1    Sig: TAKE 1 TABLET BY MOUTH EVERY DAY     Cardiovascular:  Angiotensin Receptor Blockers Passed - 12/03/2022  7:05 PM      Passed - Cr in normal range and within 180 days    Creatinine, Ser  Date Value Ref Range Status  09/12/2022 0.95 0.61 - 1.24 mg/dL Final         Passed - K in normal range and within 180 days    Potassium  Date Value Ref Range Status  09/12/2022 3.8 3.5 - 5.1 mmol/L Final         Passed - Patient is not pregnant      Passed - Last BP in normal range    BP Readings from Last 1 Encounters:  11/07/22 125/74         Passed - Valid encounter within last 6 months    Recent Outpatient Visits           3 months ago DM type 2 with diabetic mixed hyperlipidemia (HCC)   [facility] Jonah Blue B, MD   7 months ago DM type 2 with diabetic mixed hyperlipidemia [facility])   [facility] Marcine Matar, MD   10 months ago Essential hypertension   [facility] [place], [place] L, RPH-CPP   10 months ago New onset type 2 diabetes mellitus [facility])    [facility] Marcine Matar, MD   11 months ago Establishing care with new doctor, encounter for   [facility] Marcine Matar, MD       Future Appointments             In 1 month Laural Benes, Binnie Rail, MD [facility]   In 5 months Shari Prows, Kathlynn Grate, MD [facility]

## 2023-01-09 ENCOUNTER — Encounter: Payer: Self-pay | Admitting: Internal Medicine

## 2023-01-09 ENCOUNTER — Ambulatory Visit: Payer: BC Managed Care – PPO | Attending: Internal Medicine | Admitting: Internal Medicine

## 2023-01-09 VITALS — BP 163/84 | HR 72 | Temp 98.0°F | Ht 72.0 in | Wt 222.0 lb

## 2023-01-09 DIAGNOSIS — Z794 Long term (current) use of insulin: Secondary | ICD-10-CM

## 2023-01-09 DIAGNOSIS — I152 Hypertension secondary to endocrine disorders: Secondary | ICD-10-CM

## 2023-01-09 DIAGNOSIS — R7989 Other specified abnormal findings of blood chemistry: Secondary | ICD-10-CM

## 2023-01-09 DIAGNOSIS — E782 Mixed hyperlipidemia: Secondary | ICD-10-CM | POA: Diagnosis not present

## 2023-01-09 DIAGNOSIS — E1169 Type 2 diabetes mellitus with other specified complication: Secondary | ICD-10-CM | POA: Diagnosis not present

## 2023-01-09 DIAGNOSIS — E1159 Type 2 diabetes mellitus with other circulatory complications: Secondary | ICD-10-CM

## 2023-01-09 LAB — POCT GLYCOSYLATED HEMOGLOBIN (HGB A1C): HbA1c, POC (controlled diabetic range): 7.7 % — AB (ref 0.0–7.0)

## 2023-01-09 MED ORDER — METFORMIN HCL 500 MG PO TABS: 500.0000 mg | ORAL_TABLET | Freq: Two times a day (BID) | ORAL | 3 refills | Status: DC

## 2023-01-09 MED ORDER — VALSARTAN 80 MG PO TABS: 80.0000 mg | ORAL_TABLET | Freq: Every day | ORAL | 1 refills | Status: DC

## 2023-01-09 NOTE — Progress Notes (Signed)
Patient ID: Stephen Lozano, male    DOB: June 29, 1971  MRN: [id number]  CC: Hypertension   Subjective: [name] is a 52 y.o. male who presents for chronic ds management His concerns today include:  Patient with history of HTN, mild intermittent asthma, DM type 2, HL, abn LFTs, hepatic steatosis, recurrent pericarditis.   DM: Results for orders placed or performed in visit on 01/09/23  POCT glycosylated hemoglobin (Hb A1C)  Result Value Ref Range   Hemoglobin A1C     HbA1c POC (<> result, manual entry)     HbA1c, POC (prediabetic range)     HbA1c, POC (controlled diabetic range) 7.7 (A) 0.0 - 7.0 %  Not checking blood sugars He continues to try to eat as healthy as he can.  Does not eat red meat or fried foods.  He exercises by walking and also rides his bike twice a week.  Abnormal LFTs: AST/ALT remain elevated.  He saw the gastroenterologist in follow-up.  Ferritin level was elevated at 743.  He ordered hemochromatosis study but patient has not done it as yet.  He plans to go and get the blood drawn for this.  He does not drink alcohol in excess.  BP elevated today.  States that he has been out of the valsartan for a few months.  Continues to take the amlodipine 10 mg daily.  He limits salt in the foods.  No chest pains or shortness of breath.  No lower extremity edema.  Since last visit with me he had a recurrent episode of pericarditis and was on colchicine for a few weeks.  Subsequently saw cardiology in follow-up.  Echo revealed normal EF, he had moderate TR.  C-reactive protein and ESR were normal.  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 18 g 6   amLODipine (NORVASC) 10 MG tablet Take 1 tablet (10 mg total) by mouth daily. 90 tablet 1   Multiple Vitamins-Minerals (MULTIVITAMIN WITH MINERALS) tablet Take 1 tablet by mouth daily.     No current facility-administered medications on file prior to visit.    Allergies  Allergen Reactions   Shellfish Allergy Anaphylaxis    Social History   Socioeconomic History   Marital status: Single    Spouse name: Not on file   Number of children: 6   Years of education: Not on file   Highest education level: Bachelor's degree (e.g., BA, AB, BS)  Occupational History   Occupation: Therapist, nutritional  Tobacco Use   Smoking status: Never   Smokeless tobacco: Never  Vaping Use   Vaping Use: Some days   Devices: only when drinking  Substance and Sexual Activity   Alcohol use: Yes    Comment: occasional   Drug use: Never   Sexual activity: Yes  Other Topics Concern   Not on file  Social History Narrative   Not on file   Social Determinants of Health   Financial Resource Strain: Not on file  Food Insecurity: Not on file  Transportation Needs: Not on file  Physical Activity: Not on file  Stress: Not on file  Social Connections: Not on file  Intimate Partner Violence: Not on file    Family History  Problem Relation Age of Onset   Diabetes Mother    Diabetes Father    Hypertension Maternal Aunt    Hypertension Maternal Uncle    Colon cancer Neg Hx    Esophageal cancer Neg Hx     Past Surgical History:  Procedure Laterality Date   APPENDECTOMY     INGUINAL HERNIA REPAIR Right     ROS: Review of Systems Negative except as stated above  PHYSICAL EXAM: BP (!) 163/84   Pulse 72   Temp 98 F (36.7 C) (Oral)   Ht 6' (1.829 m)   Wt 222 lb (100.7 kg)   SpO2 97%   BMI 30.11 kg/m   Wt Readings from Last 3 Encounters:  01/09/23 222 lb (100.7 kg)  11/07/22 218 lb (98.9 kg)  09/23/22 218 lb 6.4 oz (99.1  kg)    Physical Exam General appearance - alert, well appearing, middle-age African-American male and in no distress Neck - supple, no significant adenopathy Chest - clear to auscultation, no wheezes, rales or rhonchi, symmetric air entry Heart - normal rate, regular rhythm, normal S1, S2, no murmurs, rubs, clicks or gallops Extremities - peripheral pulses normal, no pedal edema, no clubbing or cyanosis     Latest Ref Rng & Units 10/15/2022    3:00 PM 09/12/2022    9:24 AM 06/20/2022    3:38 PM  CMP  Glucose 70 - 99 mg/dL  782  956   BUN 6 - 20 mg/dL  11  13   Creatinine 2.13 - 1.24 mg/dL  0.86  5.78   Sodium 469 - 145 mmol/L  137  136   Potassium 3.5 - 5.1 mmol/L  3.8  3.6   Chloride 98 - 111 mmol/L  104  102   CO2 22 - 32 mmol/L  19  25   Calcium 8.9 - 10.3 mg/dL  9.1  9.4   Total Protein 6.0 - 8.3 g/dL 8.1   8.2   Total Bilirubin 0.2 - 1.2 mg/dL 0.8   1.0   Alkaline Phos 39 - 117 U/L 60   65   AST 0 - 37 U/L 135   120   ALT 0 - 53 U/L 163   144    Lipid Panel     Component Value Date/Time   CHOL 228 (H) 05/02/2022 1641   TRIG 449 (H) 05/02/2022 1641   HDL 42 05/02/2022 1641   CHOLHDL 5.4 (H) 05/02/2022 1641   LDLCALC 109 (H) 05/02/2022 1641    CBC    Component Value Date/Time   WBC 4.5 09/12/2022 0924   RBC 4.60 09/12/2022 0924   HGB 14.5 09/12/2022 0924   HCT 43.0 09/12/2022 0924   PLT 238 09/12/2022 0924   MCV 93.5 09/12/2022 0924   MCH 31.5 09/12/2022 0924   MCHC 33.7 09/12/2022 0924   RDW 11.8 09/12/2022 0924   LYMPHSABS 2.1 06/20/2022 1538   MONOABS 0.7 06/20/2022 1538   EOSABS 0.2 06/20/2022 1538   BASOSABS 0.1 06/20/2022 1538    ASSESSMENT AND PLAN:  1. DM type 2 with diabetic mixed hyperlipidemia (HCC) Not at goal. I recommend that he continue his healthy eating habits and regular exercise. I recommend starting metformin 500 mg twice a day.  Advised that the medication can cause bloating and diarrhea.  He will let me know if this occurs. -If he  screens negative for hemochromatosis,  we will consider starting statin therapy with him taking it at least 3 days a week for starters. - metFORMIN (GLUCOPHAGE) 500 MG tablet; Take 1 tablet (500 mg total) by mouth 2 (two) times daily with a meal.  Dispense: 180 tablet; Refill: 3 - POCT glycosylated hemoglobin (Hb A1C)  2. Hypertension associated with type 2 diabetes mellitus (HCC) Not at goal.  He has been out of Diovan.  Refill sent.  Continue amlodipine. - valsartan (DIOVAN) 80 MG tablet; Take 1 tablet (80 mg total) by mouth daily.  Dispense: 90 tablet; Refill: 1  3. Abnormal LFTs Patient plans to go and get the blood testing done to screen for hemochromatosis.    Patient was given the opportunity to ask questions.  Patient verbalized understanding of the plan and was able to repeat key elements of the plan.   This documentation was completed using Paediatric nurse.  Any transcriptional errors are unintentional.  Orders Placed This Encounter  Procedures   POCT glycosylated hemoglobin (Hb A1C)     Requested Prescriptions   Signed Prescriptions Disp Refills   valsartan (DIOVAN) 80 MG tablet 90 tablet 1    Sig: Take 1 tablet (80 mg total) by mouth daily.   metFORMIN (GLUCOPHAGE) 500 MG tablet 180 tablet 3    Sig: Take 1 tablet (500 mg total) by mouth 2 (two) times daily with a meal.    Return in about 4 months (around 05/12/2023).  Jonah Blue, MD, FACP

## 2023-02-27 ENCOUNTER — Other Ambulatory Visit: Payer: Self-pay | Admitting: Internal Medicine

## 2023-02-27 DIAGNOSIS — E1159 Type 2 diabetes mellitus with other circulatory complications: Secondary | ICD-10-CM

## 2023-05-15 ENCOUNTER — Ambulatory Visit: Payer: BC Managed Care – PPO | Attending: Internal Medicine | Admitting: Internal Medicine

## 2023-05-15 ENCOUNTER — Encounter: Payer: Self-pay | Admitting: Internal Medicine

## 2023-05-15 VITALS — BP 158/87 | HR 73 | Wt 211.2 lb

## 2023-05-15 DIAGNOSIS — E1159 Type 2 diabetes mellitus with other circulatory complications: Secondary | ICD-10-CM

## 2023-05-15 DIAGNOSIS — Z2821 Immunization not carried out because of patient refusal: Secondary | ICD-10-CM

## 2023-05-15 DIAGNOSIS — Z7984 Long term (current) use of oral hypoglycemic drugs: Secondary | ICD-10-CM

## 2023-05-15 DIAGNOSIS — E119 Type 2 diabetes mellitus without complications: Secondary | ICD-10-CM

## 2023-05-15 DIAGNOSIS — I152 Hypertension secondary to endocrine disorders: Secondary | ICD-10-CM

## 2023-05-15 DIAGNOSIS — R7989 Other specified abnormal findings of blood chemistry: Secondary | ICD-10-CM | POA: Diagnosis not present

## 2023-05-15 DIAGNOSIS — E782 Mixed hyperlipidemia: Secondary | ICD-10-CM | POA: Diagnosis not present

## 2023-05-15 DIAGNOSIS — E1169 Type 2 diabetes mellitus with other specified complication: Secondary | ICD-10-CM | POA: Diagnosis not present

## 2023-05-15 LAB — POCT GLYCOSYLATED HEMOGLOBIN (HGB A1C): HbA1c, POC (controlled diabetic range): 7.4 % — AB (ref 0.0–7.0)

## 2023-05-15 LAB — GLUCOSE, POCT (MANUAL RESULT ENTRY): POC Glucose: 119 mg/dL — AB (ref 70–99)

## 2023-05-15 MED ORDER — METFORMIN HCL 500 MG PO TABS: ORAL_TABLET | ORAL | 3 refills | Status: DC

## 2023-05-15 NOTE — Progress Notes (Signed)
Patient ID: Stephen Lozano, male    DOB: 1970-09-03  MRN: [id number]  CC: Diabetes (Patient states that for the last 6 months  he has been smelling carbon monoxide) and Medication Refill   Subjective: Stephen Lozano is a 52 y.o. male who presents for chronic ds management. His concerns today include:  Patient with history of HTN, mild intermittent asthma, DM type 2, HL, abn LFTs, hepatic steatosis, recurrent pericarditis.    DM: Results for orders placed or performed in visit on 05/15/23  POCT glycosylated hemoglobin (Hb A1C)  Result Value Ref Range   Hemoglobin A1C     HbA1c POC (<> result, manual entry)     HbA1c, POC (prediabetic range)     HbA1c, POC (controlled diabetic range) 7.4 (A) 0.0 - 7.0 %  POCT glucose (manual entry)  Result Value Ref Range   POC Glucose 119 (A) 70 - 99 mg/dl  Tolerating Metformin 865 mg twice a day that was started on last visit.  A1c has improved some. Eating habits: Red meat about once a mth.  No sweets, cut back on carbs; eating more fiber and green veggies Working 12-14 hrs a day 6 days a wk, so not getting in as much exercise outside of work but does a lot of walking on his job.  Wgh down 11 lbs in past 4 mths  HTN:  out of Diovan 80 mg x 2 days; taking the Norvasc 10 mg consistently and took already for today Limits salt in foods Checks BP Q 3 days.   No CP/SOB  For past 6-8 mths he has been smelling car exhaust fumes intermittently when is not even around or in his car.  Has electric stove at home.  Smells it at different places like even sitting in restaurant.  Can last for hrs.  He has used a neti pot thinking his nasal passage may need to be cleaned out but this has not helped.   No change in taste.   He would like to have his liver function test rechecked.  He has had a mild persistent elevation in AST/ALT.  Last time he saw the gastroenterologist, ferritin level was elevated.  Hemochromatosis study was ordered but patient has not had  it done. 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 18 g 6   amLODipine (NORVASC) 10 MG tablet TAKE 1 TABLET BY MOUTH EVERY DAY 90 tablet 1   Multiple Vitamins-Minerals (MULTIVITAMIN WITH MINERALS) tablet Take 1 tablet by mouth daily.     valsartan (DIOVAN) 80 MG tablet Take 1 tablet (80 mg total) by mouth daily. 90 tablet 1   No current facility-administered medications on file prior to visit.    Allergies  Allergen Reactions   Shellfish Allergy Anaphylaxis    Social History   Socioeconomic History   Marital status: Single    Spouse name: Not on file   Number of children: 6   Years of education: Not on file   Highest education level: Bachelor's degree (e.g., BA, AB, BS)  Occupational History   Occupation: Therapist, nutritional  Tobacco Use   Smoking  status: Never   Smokeless tobacco: Never  Vaping Use   Vaping status: Some Days   Devices: only when drinking  Substance and Sexual Activity   Alcohol use: Yes    Comment: occasional   Drug use: Never   Sexual activity: Yes  Other Topics Concern   Not on file  Social History Narrative   Not on file   Social Determinants of Health   Financial Resource Strain: Low Risk  (05/11/2023)   Overall Financial Resource Strain (CARDIA)    Difficulty of Paying Living Expenses: Not hard at all  Food Insecurity: No Food Insecurity (05/11/2023)   Hunger Vital Sign    Worried About Running Out of Food in the Last Year: Never true    Ran Out of Food in the Last Year: Never true  Transportation Needs: No Transportation Needs (05/11/2023)   PRAPARE -  Administrator, Civil Service (Medical): No    Lack of Transportation (Non-Medical): No  Physical Activity: Insufficiently Active (05/11/2023)   Exercise Vital Sign    Days of Exercise per Week: 2 days    Minutes of Exercise per Session: 30 min  Stress: No Stress Concern Present (05/11/2023)   [employer] of Occupational Health - Occupational Stress Questionnaire    Feeling of Stress : Not at all  Social Connections: Socially Integrated (05/11/2023)   Social Connection and Isolation Panel [NHANES]    Frequency of Communication with Friends and Family: Three times a week    Frequency of Social Gatherings with Friends and Family: Twice a week    Attends Religious Services: 1 to 4 times per year    Active Member of [employer] or Organizations: Yes    Attends Banker Meetings: 1 to 4 times per year    Marital Status: Living with partner  Intimate Partner Violence: Unknown (11/14/2021)   Received from [facility], [facility]   HITS    Physically Hurt: Not on file    Insult or Talk Down To: Not on file    Threaten Physical Harm: Not on file    Scream or Curse: Not on file    Family History  Problem Relation Age of Onset   Diabetes Mother    Diabetes Father    Hypertension Maternal Aunt    Hypertension Maternal Uncle    Colon cancer Neg Hx    Esophageal cancer Neg Hx     Past Surgical History:  Procedure Laterality Date   APPENDECTOMY     INGUINAL HERNIA REPAIR Right     ROS: Review of Systems Negative except as stated above  PHYSICAL EXAM: BP (!) 158/87   Pulse 73   Wt 211 lb 3.2 oz (95.8 kg)   SpO2 95%   BMI 28.64 kg/m   Wt Readings from Last 3 Encounters:  05/15/23 211 lb 3.2 oz (95.8 kg)  01/09/23 222 lb (100.7 kg)  11/07/22 218 lb (98.9 kg)    Physical Exam   General appearance - alert, well appearing, middle-aged African-American male and in no distress Mental status - normal mood, behavior, speech, dress, motor activity, and  thought processes Neck - supple, no significant adenopathy Chest - clear to auscultation, no wheezes, rales or rhonchi, symmetric air entry Heart - normal rate, regular rhythm, normal S1, S2, no murmurs, rubs, clicks or gallops Extremities - peripheral pulses normal, no pedal edema, no clubbing or cyanosis     Latest Ref Rng & Units 10/15/2022    3:00 PM 09/12/2022  9:24 AM 06/20/2022    3:38 PM  CMP  Glucose 70 - 99 mg/dL  161  096   BUN 6 - 20 mg/dL  11  13   Creatinine 0.45 - 1.24 mg/dL  4.09  8.11   Sodium 914 - 145 mmol/L  137  136   Potassium 3.5 - 5.1 mmol/L  3.8  3.6   Chloride 98 - 111 mmol/L  104  102   CO2 22 - 32 mmol/L  19  25   Calcium 8.9 - 10.3 mg/dL  9.1  9.4   Total Protein 6.0 - 8.3 g/dL 8.1   8.2   Total Bilirubin 0.2 - 1.2 mg/dL 0.8   1.0   Alkaline Phos 39 - 117 U/L 60   65   AST 0 - 37 U/L 135   120   ALT 0 - 53 U/L 163   144    Lipid Panel     Component Value Date/Time   CHOL 228 (H) 05/02/2022 1641   TRIG 449 (H) 05/02/2022 1641   HDL 42 05/02/2022 1641   CHOLHDL 5.4 (H) 05/02/2022 1641   LDLCALC 109 (H) 05/02/2022 1641    CBC    Component Value Date/Time   WBC 4.5 09/12/2022 0924   RBC 4.60 09/12/2022 0924   HGB 14.5 09/12/2022 0924   HCT 43.0 09/12/2022 0924   PLT 238 09/12/2022 0924   MCV 93.5 09/12/2022 0924   MCH 31.5 09/12/2022 0924   MCHC 33.7 09/12/2022 0924   RDW 11.8 09/12/2022 0924   LYMPHSABS 2.1 06/20/2022 1538   MONOABS 0.7 06/20/2022 1538   EOSABS 0.2 06/20/2022 1538   BASOSABS 0.1 06/20/2022 1538    ASSESSMENT AND PLAN: 1. DM type 2 with diabetic mixed hyperlipidemia (HCC) A1c has improved but is not at goal as yet.  Recommend increasing metformin to 1000 mg in the morning and 500 mg in the evening.  Continue healthy eating habits and trying to move is much as he can. - POCT glycosylated hemoglobin (Hb A1C) - POCT glucose (manual entry) - Lipid panel - Microalbumin / creatinine urine ratio - metFORMIN (GLUCOPHAGE)  500 MG tablet; Take 2 tabs in mornings and one in evening  Dispense: 270 tablet; Refill: 3  2. Diabetes mellitus treated with oral medication (HCC) See #1 above.  3. Hypertension associated with type 2 diabetes mellitus (HCC) Repeat blood pressure today improved but still not at goal.  He has been out of the Diovan 80 mg for the past 2 days.  He plans to pick up refill today.  Continue Norvasc 10 mg daily.  4. Abnormal LFTs - Hepatic Function Panel - Hemochromatosis DNA-PCR(c282y,h63d)  5. Elevated ferritin - Hemochromatosis DNA-PCR(c282y,h63d)  6. Influenza vaccination declined Recommended.  Patient declined.  7. Herpes zoster vaccination declined   Patient was given the opportunity to ask questions.  Patient verbalized understanding of the plan and was able to repeat key elements of the plan.   This documentation was completed using Paediatric nurse.  Any transcriptional errors are unintentional.  Orders Placed This Encounter  Procedures   Lipid panel   Hepatic Function Panel   Hemochromatosis DNA-PCR(c282y,h63d)   Microalbumin / creatinine urine ratio   POCT glycosylated hemoglobin (Hb A1C)   POCT glucose (manual entry)     Requested Prescriptions   Signed Prescriptions Disp Refills   metFORMIN (GLUCOPHAGE) 500 MG tablet 270 tablet 3    Sig: Take 2 tabs in mornings and one in evening  Return in about 4 months (around 09/12/2023).  Jonah Blue, MD, FACP

## 2023-05-16 ENCOUNTER — Other Ambulatory Visit: Payer: Self-pay | Admitting: Internal Medicine

## 2023-05-16 DIAGNOSIS — R7989 Other specified abnormal findings of blood chemistry: Secondary | ICD-10-CM

## 2023-05-16 DIAGNOSIS — K760 Fatty (change of) liver, not elsewhere classified: Secondary | ICD-10-CM

## 2023-05-25 LAB — HEPATIC FUNCTION PANEL
ALT: 246 [IU]/L — ABNORMAL HIGH (ref 0–44)
AST: 270 [IU]/L — ABNORMAL HIGH (ref 0–40)
Albumin: 4.7 g/dL (ref 3.8–4.9)
Alkaline Phosphatase: 76 [IU]/L (ref 44–121)
Bilirubin Total: 0.9 mg/dL (ref 0.0–1.2)
Bilirubin, Direct: 0.31 mg/dL (ref 0.00–0.40)
Total Protein: 8.3 g/dL (ref 6.0–8.5)

## 2023-05-25 LAB — LIPID PANEL
Chol/HDL Ratio: 4.8 ratio (ref 0.0–5.0)
Cholesterol, Total: 237 mg/dL — ABNORMAL HIGH (ref 100–199)
HDL: 49 mg/dL (ref >39)
LDL Chol Calc (NIH): 158 mg/dL — ABNORMAL HIGH (ref 0–99)
Triglycerides: 164 mg/dL — ABNORMAL HIGH (ref 0–149)
VLDL Cholesterol Cal: 30 mg/dL (ref 5–40)

## 2023-05-25 LAB — HEMOCHROMATOSIS DNA-PCR(C282Y,H63D)

## 2023-05-25 LAB — MICROALBUMIN / CREATININE URINE RATIO
Creatinine, Urine: 180.5 mg/dL
Microalb/Creat Ratio: 77 mg/g{creat} — ABNORMAL HIGH (ref 0–29)
Microalbumin, Urine: 138.6 ug/mL

## 2023-05-29 ENCOUNTER — Ambulatory Visit: Payer: BC Managed Care – PPO | Admitting: Cardiology

## 2023-08-12 ENCOUNTER — Other Ambulatory Visit: Payer: Self-pay | Admitting: Internal Medicine

## 2023-08-12 DIAGNOSIS — R519 Headache, unspecified: Secondary | ICD-10-CM | POA: Diagnosis not present

## 2023-08-12 DIAGNOSIS — R030 Elevated blood-pressure reading, without diagnosis of hypertension: Secondary | ICD-10-CM | POA: Diagnosis not present

## 2023-08-12 DIAGNOSIS — R7989 Other specified abnormal findings of blood chemistry: Secondary | ICD-10-CM | POA: Diagnosis not present

## 2023-08-12 DIAGNOSIS — R42 Dizziness and giddiness: Secondary | ICD-10-CM | POA: Diagnosis not present

## 2023-08-12 DIAGNOSIS — M79622 Pain in left upper arm: Secondary | ICD-10-CM | POA: Diagnosis not present

## 2023-08-12 DIAGNOSIS — R9431 Abnormal electrocardiogram [ECG] [EKG]: Secondary | ICD-10-CM | POA: Diagnosis not present

## 2023-08-12 DIAGNOSIS — I152 Hypertension secondary to endocrine disorders: Secondary | ICD-10-CM

## 2023-08-12 DIAGNOSIS — R200 Anesthesia of skin: Secondary | ICD-10-CM | POA: Diagnosis not present

## 2023-09-18 ENCOUNTER — Encounter: Payer: Self-pay | Admitting: Internal Medicine

## 2023-09-18 ENCOUNTER — Ambulatory Visit: Payer: BC Managed Care – PPO | Attending: Internal Medicine | Admitting: Internal Medicine

## 2023-09-18 VITALS — BP 157/80 | HR 77 | Temp 98.1°F | Ht 72.0 in | Wt 213.0 lb

## 2023-09-18 DIAGNOSIS — R7989 Other specified abnormal findings of blood chemistry: Secondary | ICD-10-CM

## 2023-09-18 DIAGNOSIS — E1169 Type 2 diabetes mellitus with other specified complication: Secondary | ICD-10-CM

## 2023-09-18 DIAGNOSIS — E119 Type 2 diabetes mellitus without complications: Secondary | ICD-10-CM

## 2023-09-18 DIAGNOSIS — E782 Mixed hyperlipidemia: Secondary | ICD-10-CM | POA: Diagnosis not present

## 2023-09-18 DIAGNOSIS — E1159 Type 2 diabetes mellitus with other circulatory complications: Secondary | ICD-10-CM

## 2023-09-18 DIAGNOSIS — I10 Essential (primary) hypertension: Secondary | ICD-10-CM

## 2023-09-18 DIAGNOSIS — R202 Paresthesia of skin: Secondary | ICD-10-CM

## 2023-09-18 DIAGNOSIS — Z7984 Long term (current) use of oral hypoglycemic drugs: Secondary | ICD-10-CM

## 2023-09-18 DIAGNOSIS — I152 Hypertension secondary to endocrine disorders: Secondary | ICD-10-CM

## 2023-09-18 MED ORDER — VALSARTAN 160 MG PO TABS: 160.0000 mg | ORAL_TABLET | Freq: Every day | ORAL | 1 refills | Status: DC

## 2023-09-18 MED ORDER — METFORMIN HCL 1000 MG PO TABS: 1000.0000 mg | ORAL_TABLET | Freq: Two times a day (BID) | ORAL | 2 refills | Status: DC

## 2023-09-18 NOTE — Patient Instructions (Signed)
 Increase Metformin to 1000 mg twice a day. Increase Valsartan to 160 mg daily.

## 2023-09-18 NOTE — Progress Notes (Signed)
 Patient ID: Stephen Lozano, male    DOB: Nov 26, 1970  MRN: [id number]  CC: Hypertension (HTN f/u. /Pt informed during imaging at [facility] that the R top & bottom chambers of heart "are not communicating properly /No to pneumonia vax)   Subjective: Stephen Lozano is a 53 y.o. male who presents for chronic ds management. His concerns today include:  Patient with history of HTN, mild intermittent asthma, DM type 2, HL, abn LFTs, hepatic steatosis, recurrent pericarditis.   Seen in ER at [facility] 08/12/2023 for LT sided tingling in arm/leg.  CTA of the head and neck were negative.  Symptom has resolved.  Reports occasional tingling since then.   I reviewed his blood test.  His AST and ALT were mildly elevated at 77/117.  When we had checked LFTs in November of last year, these values were 270/246.  Screen for hemochromatosis was negative.  HTN: on Norvasc 10 mg and Diovan 80 mg daily and takes consistently. Home BP readings every other day for past 2 wks has range 127-132/77-83 Limits salt No CP/SOB  DM: A1C 7.2 He is on metformin 1000 mg in the morning and 500 mg in the evening. -not checking BS Still doing good with eating habits - no sweets and decrease carbs. Has also cut back on ETOH; use to do 1-2 shots on wkends.  Does a lot of walking at work at least 15,000 steps a day.   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 18 g 6   amLODipine (NORVASC) 10 MG tablet TAKE 1 TABLET BY MOUTH EVERY DAY 90 tablet 1   Multiple Vitamins-Minerals (MULTIVITAMIN WITH MINERALS) tablet Take 1 tablet by mouth daily.      No current facility-administered medications on file prior to visit.    Allergies  Allergen Reactions   Shellfish Allergy Anaphylaxis    Social History   Socioeconomic History   Marital status: Single    Spouse name: Not on file   Number of children: 6   Years of education: Not on file   Highest education level: Bachelor's degree (e.g., BA, AB, BS)  Occupational History   Occupation: Therapist, nutritional  Tobacco Use   Smoking status: Never   Smokeless tobacco: Never  Vaping Use   Vaping status: Some Days   Devices: only when drinking  Substance and Sexual Activity   Alcohol use: Yes    Comment: occasional   Drug use: Never   Sexual activity: Yes  Other Topics Concern   Not on file  Social History Narrative   Not on file   Social Drivers of Health   Financial Resource Strain: Low Risk  (09/14/2023)   Overall Financial Resource Strain (CARDIA)    Difficulty of Paying Living Expenses: Not hard at all  Food Insecurity: No Food Insecurity (09/14/2023)   Hunger Vital Sign    Worried About Running Out of Food in the Last Year: Never true    Ran Out of Food in the Last Year: Never true  Transportation Needs: No Transportation Needs (09/14/2023)   PRAPARE - Administrator, Civil Service (Medical):  No    Lack of Transportation (Non-Medical): No  Physical Activity: Insufficiently Active (09/14/2023)   Exercise Vital Sign    Days of Exercise per Week: 3 days    Minutes of Exercise per Session: 30 min  Stress: No Stress Concern Present (09/14/2023)   [employer] of Occupational Health - Occupational Stress Questionnaire    Feeling of Stress : Not at all  Social Connections: Socially Integrated (09/14/2023)   Social Connection and Isolation Panel [NHANES]    Frequency of Communication with Friends and Family: Three times a week    Frequency of Social Gatherings with Friends and Family: Once a week    Attends Religious Services: 1 to 4 times per year    Active  Member of [employer] or Organizations: Yes    Attends Banker Meetings: 1 to 4 times per year    Marital Status: Married  Catering manager Violence: Unknown (11/14/2021)   Received from [facility], [facility]   HITS    Physically Hurt: Not on file    Insult or Talk Down To: Not on file    Threaten Physical Harm: Not on file    Scream or Curse: Not on file    Family History  Problem Relation Age of Onset   Diabetes Mother    Diabetes Father    Hypertension Maternal Aunt    Hypertension Maternal Uncle    Colon cancer Neg Hx    Esophageal cancer Neg Hx     Past Surgical History:  Procedure Laterality Date   APPENDECTOMY     INGUINAL HERNIA REPAIR Right     ROS: Review of Systems Negative except as stated above  PHYSICAL EXAM: BP (!) 157/80 (BP Location: Left Arm, Patient Position: Sitting, Cuff Size: Normal)   Pulse 77   Temp 98.1 F (36.7 C) (Oral)   Ht 6' (1.829 m)   Wt 213 lb (96.6 kg)   SpO2 96%   BMI 28.89 kg/m   Physical Exam  General appearance - alert, well appearing, and in no distress Mental status - normal mood, behavior, speech, dress, motor activity, and thought processes Neck - supple, no significant adenopathy Chest - clear to auscultation, no wheezes, rales or rhonchi, symmetric air entry Heart - normal rate, regular rhythm, normal S1, S2, no murmurs, rubs, clicks or gallops Extremities - peripheral pulses normal, no pedal edema, no clubbing or cyanosis     Latest Ref Rng & Units 05/15/2023    4:45 PM 10/15/2022    3:00 PM 09/12/2022    9:24 AM  CMP  Glucose 70 - 99 mg/dL   161   BUN 6 - 20 mg/dL   11   Creatinine 0.96 - 1.24 mg/dL   0.45   Sodium 409 - 811 mmol/L   137   Potassium 3.5 - 5.1 mmol/L   3.8   Chloride 98 - 111 mmol/L   104   CO2 22 - 32 mmol/L   19   Calcium 8.9 - 10.3 mg/dL   9.1   Total Protein 6.0 - 8.5 g/dL 8.3  8.1    Total Bilirubin 0.0 - 1.2 mg/dL 0.9  0.8    Alkaline Phos 44 - 121 IU/L 76  60    AST 0 - 40  IU/L 270  135    ALT 0 - 44 IU/L 246  163     Lipid Panel     Component Value Date/Time   CHOL 237 (H) 05/15/2023 1645  TRIG 164 (H) 05/15/2023 1645   HDL 49 05/15/2023 1645   CHOLHDL 4.8 05/15/2023 1645   LDLCALC 158 (H) 05/15/2023 1645    CBC    Component Value Date/Time   WBC 4.5 09/12/2022 0924   RBC 4.60 09/12/2022 0924   HGB 14.5 09/12/2022 0924   HCT 43.0 09/12/2022 0924   PLT 238 09/12/2022 0924   MCV 93.5 09/12/2022 0924   MCH 31.5 09/12/2022 0924   MCHC 33.7 09/12/2022 0924   RDW 11.8 09/12/2022 0924   LYMPHSABS 2.1 06/20/2022 1538   MONOABS 0.7 06/20/2022 1538   EOSABS 0.2 06/20/2022 1538   BASOSABS 0.1 06/20/2022 1538    ASSESSMENT AND PLAN: 1. DM type 2 with diabetic mixed hyperlipidemia (HCC) (Primary) A1c has improved but is not at goal. Encouraged him to continue healthy eating habits and moving as much as he can. We discussed increasing metformin to 1 g twice a day. - POCT glycosylated hemoglobin (Hb A1C) - POCT glucose (manual entry) - metFORMIN (GLUCOPHAGE) 1000 MG tablet; Take 1 tablet (1,000 mg total) by mouth 2 (two) times daily with a meal.  Dispense: 180 tablet; Refill: 2  2. Diabetes mellitus treated with oral medication (HCC) See #1 above  3. Hypertension associated with type 2 diabetes mellitus (HCC) Not at goal. Continue Norvasc 10 mg daily.  Increase Diovan to 160 mg daily.  After being on the increased dose for 1 month, he will return to the lab for chemistry - Comprehensive metabolic panel; Future - valsartan (DIOVAN) 160 MG tablet; Take 1 tablet (160 mg total) by mouth daily.  Dispense: 90 tablet; Refill: 1  4. Abnormal LFTs AST and ALT have decreased significantly on recent blood test done through the ER at [facility].  We will recheck liver function test when he comes back to the lab in a month for chemistry.  If levels have normalized, we will need to discuss getting him on statin therapy. - Comprehensive metabolic panel; Future  5.   Tingling in extremities -This is pretty much resolved since his ER visit.  Patient was given the opportunity to ask questions.  Patient verbalized understanding of the plan and was able to repeat key elements of the plan.   This documentation was completed using Paediatric nurse.  Any transcriptional errors are unintentional.  Orders Placed This Encounter  Procedures   Comprehensive metabolic panel   POCT glycosylated hemoglobin (Hb A1C)   POCT glucose (manual entry)     Requested Prescriptions   Signed Prescriptions Disp Refills   valsartan (DIOVAN) 160 MG tablet 90 tablet 1    Sig: Take 1 tablet (160 mg total) by mouth daily.   metFORMIN (GLUCOPHAGE) 1000 MG tablet 180 tablet 2    Sig: Take 1 tablet (1,000 mg total) by mouth 2 (two) times daily with a meal.    Return in about 4 months (around 01/18/2024) for give appt to return to lab in 1 mth.  Jonah Blue, MD, FACP

## 2023-09-21 LAB — POCT GLYCOSYLATED HEMOGLOBIN (HGB A1C): HbA1c, POC (controlled diabetic range): 7.2 % — AB (ref 0.0–7.0)

## 2023-09-30 ENCOUNTER — Other Ambulatory Visit: Payer: Self-pay | Admitting: Internal Medicine

## 2023-09-30 DIAGNOSIS — I152 Hypertension secondary to endocrine disorders: Secondary | ICD-10-CM

## 2023-10-19 ENCOUNTER — Other Ambulatory Visit

## 2023-10-22 ENCOUNTER — Other Ambulatory Visit

## 2023-11-02 ENCOUNTER — Encounter (HOSPITAL_COMMUNITY): Payer: Self-pay | Admitting: *Deleted

## 2023-11-02 ENCOUNTER — Ambulatory Visit (HOSPITAL_COMMUNITY)
Admission: EM | Admit: 2023-11-02 | Discharge: 2023-11-02 | Disposition: A | Discharge status: Home / Self Care | Attending: Emergency Medicine | Admitting: Emergency Medicine

## 2023-11-02 DIAGNOSIS — H1011 Acute atopic conjunctivitis, right eye: Secondary | ICD-10-CM

## 2023-11-02 MED ORDER — OLOPATADINE HCL 0.1 % OP SOLN: 1.0000 [drp] | Freq: Two times a day (BID) | OPHTHALMIC | 12 refills | Status: DC | PRN

## 2023-11-02 MED ORDER — ERYTHROMYCIN 5 MG/GM OP OINT
TOPICAL_OINTMENT | OPHTHALMIC | 0 refills | Status: DC
Start: 2023-11-02 — End: 2024-05-24

## 2023-11-02 NOTE — ED Provider Notes (Signed)
 [facility]    CSN: [id number] Arrival date & time: 11/02/23  1424      History   Chief Complaint Chief Complaint  Patient presents with   Eye Problem    HPI Stephen Lozano is a 53 y.o. male.   Patient presents to clinic for right outer conjunctival irritation.  The eye has been itchy with a watery discharge.  Woke up with his eye like this this morning.  Does have a history of seasonal allergies.  Wears glasses, not contacts.  Denies any vision changes.  Denies pain, foreign body or gritty sensation of the eye.    The history is provided by the patient and medical records.  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 Authorizing Provider  amLODipine  (NORVASC ) 10 MG tablet TAKE 1 TABLET BY MOUTH EVERY DAY 09/30/23  Yes Lawrance Presume, MD  erythromycin ophthalmic ointment Place a 1/2 inch ribbon of ointment into the lower right eyelid 4x daily for 5 days 11/02/23  Yes Harlow Lighter, Jinx Gilden  N, FNP  metFORMIN  (GLUCOPHAGE ) 1000 MG tablet Take 1 tablet (1,000 mg total) by mouth 2 (two) times daily with a meal. 09/18/23  Yes Lawrance Presume, MD  olopatadine (PATADAY) 0.1 % ophthalmic solution Place 1 drop into the right eye 2 (two) times daily as needed for allergies. 11/02/23  Yes Aili Casillas  N, FNP  valsartan  (DIOVAN ) 160 MG tablet Take 1 tablet (160 mg total) by mouth daily. 09/18/23  Yes Lawrance Presume, MD  albuterol  (VENTOLIN  HFA) 108 [phone] Base) MCG/ACT inhaler Inhale 2 puffs into the lungs every 6 (six) hours as needed for wheezing or  shortness of breath. 09/05/22   Lawrance Presume, MD  Multiple Vitamins-Minerals (MULTIVITAMIN WITH MINERALS) tablet Take 1 tablet by mouth daily.    [provider]    Family History Family History  Problem Relation Age of Onset   Diabetes Mother    Diabetes Father    Hypertension Maternal Aunt    Hypertension Maternal Uncle    Colon cancer Neg Hx    Esophageal cancer Neg Hx     Social History Social History   Tobacco Use   Smoking status: Never   Smokeless tobacco: Never  Vaping Use   Vaping status: Some Days   Devices: only when drinking  Substance Use Topics   Alcohol use: Yes    Comment: occasional   Drug use: Never     Allergies   Shellfish allergy   Review of Systems Review of Systems  Per HPI  Physical Exam Triage Vital Signs ED Triage Vitals  Encounter Vitals Group     BP 11/02/23 1537 135/79     Systolic BP Percentile --      Diastolic BP Percentile --      Pulse Rate 11/02/23 1537 68     Resp 11/02/23 1537 18     Temp 11/02/23 1537 98.3 F (36.8 C)     Temp Source 11/02/23 1537 Oral     SpO2  11/02/23 1537 95 %     Weight --      Height --      Head Circumference --      Peak Flow --      Pain Score 11/02/23 1536 4     Pain Loc --      Pain Education --      Exclude from Growth Chart --    No data found.  Updated Vital Signs BP 135/79 (BP Location: Right Arm)   Pulse 68   Temp 98.3 F (36.8 C) (Oral)   Resp 18   SpO2 95%   Visual Acuity Right Eye Distance: 20/100 (forgot his glasses today) Left Eye Distance: 20/70 (forgot his glasses today) Bilateral Distance: 20/50 (forgot his glasses today)  Right Eye Near:   Left Eye Near:    Bilateral Near:     Physical Exam Vitals and nursing note reviewed.  Constitutional:      Appearance: Normal appearance.  HENT:     Head: Normocephalic and atraumatic.     Right Ear: External ear normal.     Left Ear: External ear normal.     Nose: Nose normal.     Mouth/Throat:      Mouth: Mucous membranes are moist.  Eyes:     General: Lids are normal. Lids are everted, no foreign bodies appreciated. Vision grossly intact. Gaze aligned appropriately.     Pupils: Pupils are equal, round, and reactive to light.   Cardiovascular:     Rate and Rhythm: Normal rate.  Pulmonary:     Effort: Pulmonary effort is normal. No respiratory distress.  Skin:    General: Skin is warm and dry.  Neurological:     General: No focal deficit present.     Mental Status: He is alert.  Psychiatric:        Mood and Affect: Mood normal.        Behavior: Behavior is cooperative.      UC Treatments / Results  Labs (all labs ordered are listed, but only abnormal results are displayed) Labs Reviewed - No data to display  EKG   Radiology No results found.  Procedures Procedures (including critical care time)  Medications Ordered in UC Medications - No data to display  Initial Impression / Assessment and Plan / UC Course  I have reviewed the triage vital signs and the nursing notes.  Pertinent labs & imaging results that were available during my care of the patient were reviewed by me and considered in my medical decision making (see chart for details).  Vitals in triage viewed, patient is hemodynamically stable.  Right lateral conjunctival is injected, no obvious discharge on physical exam.  PERRLA.  Vision grossly intact.  Does not wear contacts.  Symptoms consistent with allergic conjunctivitis.  Will send in WASP for erythromycin ointment if bacterial conjunctivitis symptoms develop.  Plan of care, follow-up care return precautions given, no questions at this time.     Final Clinical Impressions(s) / UC Diagnoses   Final diagnoses:  Allergic conjunctivitis of right eye     Discharge Instructions      Use the Pataday eyedrops twice daily as needed for allergy symptoms such as watery, itchy eyes.   Wait to fill erythromycin ointment, as your symptoms do not  appear bacterial.  If you develop thick, yellow/green discharge or your eyes are matted shut in the morning then you can start the erythromycin ointment.  Return to clinic for any new or urgent  symptoms.      ED Prescriptions     Medication Sig Dispense Auth. Provider   olopatadine (PATADAY) 0.1 % ophthalmic solution Place 1 drop into the right eye 2 (two) times daily as needed for allergies. 5 mL Harlow Lighter, Levenia Skalicky  N, FNP   erythromycin ophthalmic ointment Place a 1/2 inch ribbon of ointment into the lower right eyelid 4x daily for 5 days 3.5 g Harlow Lighter, Teyanna Thielman  N, FNP      PDMP not reviewed this encounter.   Harlow Lighter, Jezabella Schriever  N, FNP 11/02/23 1600

## 2023-11-02 NOTE — ED Triage Notes (Signed)
 Pt states he woke up this morning with his right eye red, itching and draining.

## 2023-11-02 NOTE — Discharge Instructions (Addendum)
 Use the Pataday eyedrops twice daily as needed for allergy symptoms such as watery, itchy eyes.   Wait to fill erythromycin ointment, as your symptoms do not appear bacterial.  If you develop thick, yellow/green discharge or your eyes are matted shut in the morning then you can start the erythromycin ointment.  Return to clinic for any new or urgent symptoms.

## 2023-11-14 ENCOUNTER — Other Ambulatory Visit: Payer: Self-pay | Admitting: Internal Medicine

## 2023-11-14 DIAGNOSIS — I152 Hypertension secondary to endocrine disorders: Secondary | ICD-10-CM

## 2023-12-18 ENCOUNTER — Ambulatory Visit: Attending: Internal Medicine

## 2023-12-18 DIAGNOSIS — R7989 Other specified abnormal findings of blood chemistry: Secondary | ICD-10-CM | POA: Diagnosis not present

## 2023-12-18 DIAGNOSIS — I152 Hypertension secondary to endocrine disorders: Secondary | ICD-10-CM | POA: Diagnosis not present

## 2023-12-18 DIAGNOSIS — E1159 Type 2 diabetes mellitus with other circulatory complications: Secondary | ICD-10-CM

## 2023-12-19 ENCOUNTER — Ambulatory Visit: Payer: Self-pay | Admitting: Internal Medicine

## 2023-12-19 LAB — COMPREHENSIVE METABOLIC PANEL WITH GFR
ALT: 231 IU/L — ABNORMAL HIGH (ref 0–44)
AST: 152 IU/L — ABNORMAL HIGH (ref 0–40)
Albumin: 4.8 g/dL (ref 3.8–4.9)
Alkaline Phosphatase: 66 IU/L (ref 44–121)
BUN/Creatinine Ratio: 14 (ref 9–20)
BUN: 16 mg/dL (ref 6–24)
Bilirubin Total: 0.8 mg/dL (ref 0.0–1.2)
CO2: 23 mmol/L (ref 20–29)
Calcium: 10 mg/dL (ref 8.7–10.2)
Chloride: 99 mmol/L (ref 96–106)
Creatinine, Ser: 1.13 mg/dL (ref 0.76–1.27)
Globulin, Total: 3.1 g/dL (ref 1.5–4.5)
Glucose: 115 mg/dL — ABNORMAL HIGH (ref 70–99)
Potassium: 3.9 mmol/L (ref 3.5–5.2)
Sodium: 139 mmol/L (ref 134–144)
Total Protein: 7.9 g/dL (ref 6.0–8.5)
eGFR: 77 mL/min/{1.73_m2} (ref >59)

## 2024-01-22 ENCOUNTER — Encounter: Payer: Self-pay | Admitting: Internal Medicine

## 2024-01-22 ENCOUNTER — Ambulatory Visit: Attending: Internal Medicine | Admitting: Internal Medicine

## 2024-01-22 VITALS — BP 138/79 | HR 97 | Temp 98.4°F | Ht 72.0 in | Wt 210.0 lb

## 2024-01-22 DIAGNOSIS — R7989 Other specified abnormal findings of blood chemistry: Secondary | ICD-10-CM | POA: Diagnosis not present

## 2024-01-22 DIAGNOSIS — Z7984 Long term (current) use of oral hypoglycemic drugs: Secondary | ICD-10-CM

## 2024-01-22 DIAGNOSIS — E1169 Type 2 diabetes mellitus with other specified complication: Secondary | ICD-10-CM | POA: Diagnosis not present

## 2024-01-22 DIAGNOSIS — E782 Mixed hyperlipidemia: Secondary | ICD-10-CM | POA: Diagnosis not present

## 2024-01-22 DIAGNOSIS — I10 Essential (primary) hypertension: Secondary | ICD-10-CM | POA: Diagnosis not present

## 2024-01-22 DIAGNOSIS — E119 Type 2 diabetes mellitus without complications: Secondary | ICD-10-CM

## 2024-01-22 DIAGNOSIS — E1159 Type 2 diabetes mellitus with other circulatory complications: Secondary | ICD-10-CM

## 2024-01-22 LAB — GLUCOSE, POCT (MANUAL RESULT ENTRY): POC Glucose: 113 mg/dL — AB (ref 70–99)

## 2024-01-22 LAB — POCT GLYCOSYLATED HEMOGLOBIN (HGB A1C): HbA1c, POC (controlled diabetic range): 7 % (ref 0.0–7.0)

## 2024-01-22 NOTE — Patient Instructions (Signed)
 Your A1c is 7.0.  This has improved from last visit.  Our goal is to get you less than 7.  Continue the metformin  1000 mg twice a day.  Continue healthy eating habits and regular exercise.  Your blood pressure has improved but still above goal.  We have agreed to hold off on adding any new medication or increasing the dose of your medication.  Continue the valsartan  160 mg daily and amlodipine  10 mg daily.  If on next visit you are still not at goal, we will increase the valsartan .  We have recheck your liver function test today.  I will send you the results via MyChart.

## 2024-01-22 NOTE — Progress Notes (Signed)
 Patient ID: Stephen Lozano, male    DOB: Nov 10, 1970  MRN: [id number]  CC: Hypertension (HTN f/u. /No questions / concerns/No to Hep B vax today. /)   Subjective: Stephen Lozano is a 53 y.o. male who presents for chronic ds management. His concerns today include:  Patient with history of HTN, mild intermittent asthma, DM type 2, HL, abn LFTs, hepatic steatosis, recurrent pericarditis.   Discussed the use of AI scribe software for clinical note transcription with the patient, who gave verbal consent to proceed.  History of Present Illness Stephen Lozano is a 53 year old male who presents for follow-up of his chronic conditions.  He is currently managing hypertension with amlodipine  10 mg and valsartan  160 mg. His home blood pressure readings have improved, ranging from 129/80 to 138/79, compared to previous readings of 157/unknown. No chest pain or shortness of breath. He adheres to a no-salt diet.  DM: Results for orders placed or performed in visit on 01/22/24  POCT glucose (manual entry)   Collection Time: 01/22/24  4:40 PM  Result Value Ref Range   POC Glucose 113 (A) 70 - 99 mg/dl  POCT glycosylated hemoglobin (Hb A1C)   Collection Time: 01/22/24  4:41 PM  Result Value Ref Range   Hemoglobin A1C     HbA1c POC (<> result, manual entry)     HbA1c, POC (prediabetic range)     HbA1c, POC (controlled diabetic range) 7.0 0.0 - 7.0 %  For diabetes, he takes metformin  1000 mg twice daily. Initially, he experienced queasiness, particularly in the mornings, but this has resolved over the past month. He does not regularly monitor his blood glucose at home. His last A1c, checked four months ago, was 7.2%. He has made dietary changes, limiting fried foods and starches, and consuming brown rice. He drinks juice twice daily, opting for sugar-free cranberry juice in the afternoon, but continues to drink regular orange juice in the morning. In terms of physical activity, he works in a  Environmental manager and estimates walking 15,000 to 17,000 steps daily. He is preparing to travel to [place]  to assist his mother with moving into a new house.  Regarding his fatty liver, he has had previous elevated liver function tests, which were improving. He has significantly reduced alcohol intake, only consuming alcohol on special occasions.      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 18 g 6   amLODipine  (NORVASC ) 10 MG tablet TAKE 1 TABLET BY MOUTH EVERY DAY 90 tablet 1   metFORMIN  (GLUCOPHAGE ) 1000 MG tablet Take 1 tablet (1,000 mg total) by mouth 2 (two) times daily with a meal. 180 tablet 2   Multiple Vitamins-Minerals (MULTIVITAMIN WITH MINERALS) tablet Take 1 tablet by mouth daily.     valsartan  (DIOVAN ) 160 MG tablet Take 1 tablet (160 mg total) by mouth daily. 90 tablet 1   erythromycin  ophthalmic ointment Place a 1/2 inch ribbon of ointment into the lower right eyelid 4x daily for 5 days (Patient not taking: Reported on 01/22/2024) 3.5 g 0   No current facility-administered medications on file prior to  visit.    Allergies  Allergen Reactions   Shellfish Allergy Anaphylaxis    Social History   Socioeconomic History   Marital status: Single    Spouse name: Not on file   Number of children: 6   Years of education: Not on file   Highest education level: Bachelor's degree (e.g., BA, AB, BS)  Occupational History   Occupation: Therapist, nutritional  Tobacco Use   Smoking status: Never   Smokeless tobacco: Never  Vaping Use   Vaping status: Some Days   Devices: only when drinking   Substance and Sexual Activity   Alcohol use: Yes    Comment: occasional   Drug use: Never   Sexual activity: Yes  Other Topics Concern   Not on file  Social History Narrative   Not on file   Social Drivers of Health   Financial Resource Strain: Low Risk  (01/21/2024)   Overall Financial Resource Strain (CARDIA)    Difficulty of Paying Living Expenses: Not hard at all  Food Insecurity: No Food Insecurity (01/21/2024)   Hunger Vital Sign    Worried About Running Out of Food in the Last Year: Never true    Ran Out of Food in the Last Year: Never true  Transportation Needs: No Transportation Needs (01/21/2024)   PRAPARE - Administrator, Civil Service (Medical): No    Lack of Transportation (Non-Medical): No  Physical Activity: Insufficiently Active (01/21/2024)   Exercise Vital Sign    Days of Exercise per Week: 1 day    Minutes of Exercise per Session: 20 min  Stress: No Stress Concern Present (01/21/2024)   [employer] of Occupational Health - Occupational Stress Questionnaire    Feeling of Stress: Not at all  Social Connections: Socially Integrated (01/21/2024)   Social Connection and Isolation Panel    Frequency of Communication with Friends and Family: Twice a week    Frequency of Social Gatherings with Friends and Family: Twice a week    Attends Religious Services: 1 to 4 times per year    Active Member of [employer] or Organizations: Yes    Attends Banker Meetings: 1 to 4 times per year    Marital Status: Married  Catering manager Violence: Unknown (11/14/2021)   Received from [facility]   HITS    Physically Hurt: Not on file    Insult or Talk Down To: Not on file    Threaten Physical Harm: Not on file    Scream or Curse: Not on file    Family History  Problem Relation Age of Onset   Diabetes Mother    Diabetes Father    Hypertension Maternal Aunt    Hypertension Maternal Uncle    Colon cancer Neg Hx    Esophageal cancer Neg Hx      Past Surgical History:  Procedure Laterality Date   APPENDECTOMY     INGUINAL HERNIA REPAIR Right     ROS: Review of Systems Negative except as stated above  PHYSICAL EXAM: BP 138/79   Pulse 97   Temp 98.4 F (36.9 C) (Oral)   Ht 6' (1.829 m)   Wt 210 lb (95.3 kg)   SpO2 96%   BMI 28.48 kg/m   Wt Readings from Last 3 Encounters:  01/22/24 210 lb (95.3 kg)  09/18/23 213 lb (96.6 kg)  05/15/23 211 lb 3.2 oz (95.8 kg)    Physical Exam   General appearance - alert, well appearing, and in no distress  Mental status - normal mood, behavior, speech, dress, motor activity, and thought processes Neck - supple, no significant adenopathy Chest - clear to auscultation, no wheezes, rales or rhonchi, symmetric air entry Heart - normal rate, regular rhythm, normal S1, S2, no murmurs, rubs, clicks or gallops Extremities - peripheral pulses normal, no pedal edema, no clubbing or cyanosis     Latest Ref Rng & Units 12/18/2023    3:47 PM 05/15/2023    4:45 PM 10/15/2022    3:00 PM  CMP  Glucose 70 - 99 mg/dL 884     BUN 6 - 24 mg/dL 16     Creatinine 9.23 - 1.27 mg/dL 8.86     Sodium 865 - 855 mmol/L 139     Potassium 3.5 - 5.2 mmol/L 3.9     Chloride 96 - 106 mmol/L 99     CO2 20 - 29 mmol/L 23     Calcium 8.7 - 10.2 mg/dL 89.9     Total Protein 6.0 - 8.5 g/dL 7.9  8.3  8.1   Total Bilirubin 0.0 - 1.2 mg/dL 0.8  0.9  0.8   Alkaline Phos 44 - 121 IU/L 66  76  60   AST 0 - 40 IU/L 152  270  135   ALT 0 - 44 IU/L 231  246  163    Lipid Panel     Component Value Date/Time   CHOL 237 (H) 05/15/2023 1645   TRIG 164 (H) 05/15/2023 1645   HDL 49 05/15/2023 1645   CHOLHDL 4.8 05/15/2023 1645   LDLCALC 158 (H) 05/15/2023 1645    CBC    Component Value Date/Time   WBC 4.5 09/12/2022 0924   RBC 4.60 09/12/2022 0924   HGB 14.5 09/12/2022 0924   HCT 43.0 09/12/2022 0924   PLT 238 09/12/2022 0924   MCV 93.5 09/12/2022 0924   MCH 31.5 09/12/2022 0924   MCHC 33.7 09/12/2022  0924   RDW 11.8 09/12/2022 0924   LYMPHSABS 2.1 06/20/2022 1538   MONOABS 0.7 06/20/2022 1538   EOSABS 0.2 06/20/2022 1538   BASOSABS 0.1 06/20/2022 1538   Results for orders placed or performed in visit on 01/22/24  POCT glucose (manual entry)   Collection Time: 01/22/24  4:40 PM  Result Value Ref Range   POC Glucose 113 (A) 70 - 99 mg/dl  POCT glycosylated hemoglobin (Hb A1C)   Collection Time: 01/22/24  4:41 PM  Result Value Ref Range   Hemoglobin A1C     HbA1c POC (<> result, manual entry)     HbA1c, POC (prediabetic range)     HbA1c, POC (controlled diabetic range) 7.0 0.0 - 7.0 %     ASSESSMENT AND PLAN: 1. DM type 2 with diabetic mixed hyperlipidemia (HCC) (Primary) A1c remains slightly above goal with goal being less than 7.  He is at 7 today.  We agreed to have him continue the metformin  to 1 g twice a day.  He will continue trying to eat healthy and continue to move as much as he can. - POCT glycosylated hemoglobin (Hb A1C) - POCT glucose (manual entry)  2. Diabetes mellitus treated with oral medication (HCC) #1 above  3. Hypertension associated with type 2 diabetes mellitus (HCC) Systolic blood pressure remains slightly above goal.  We agreed to continue current dose of amlodipine  10 mg and valsartan  160 mg daily.  On next visit if level remains above goal of 130/80, we will either increase the valsartan  or add another  medication.  4. Abnormal LFTs Patient with MASLD.  AST and ALT were coming down when last checked 4 months ago.  We plan to recheck again today.  We do not have an updated CBC on the chart to check his fib 4 score but we will plan to do so on next visit. - Hepatic Function Panel   Patient was given the opportunity to ask questions.  Patient verbalized understanding of the plan and was able to repeat key elements of the plan.   This documentation was completed using Paediatric nurse.  Any transcriptional errors are  unintentional.  Orders Placed This Encounter  Procedures   Hepatic Function Panel   POCT glycosylated hemoglobin (Hb A1C)   POCT glucose (manual entry)     Requested Prescriptions    No prescriptions requested or ordered in this encounter    Return in about 4 months (around 05/24/2024).  Barnie Louder, MD, FACP

## 2024-01-23 ENCOUNTER — Ambulatory Visit: Payer: Self-pay | Admitting: Internal Medicine

## 2024-01-23 LAB — HEPATIC FUNCTION PANEL
ALT: 128 IU/L — ABNORMAL HIGH (ref 0–44)
AST: 104 IU/L — ABNORMAL HIGH (ref 0–40)
Albumin: 4.6 g/dL (ref 3.8–4.9)
Alkaline Phosphatase: 63 IU/L (ref 44–121)
Bilirubin Total: 0.9 mg/dL (ref 0.0–1.2)
Bilirubin, Direct: 0.35 mg/dL (ref 0.00–0.40)
Total Protein: 7.9 g/dL (ref 6.0–8.5)

## 2024-02-29 ENCOUNTER — Other Ambulatory Visit: Payer: Self-pay | Admitting: Internal Medicine

## 2024-02-29 ENCOUNTER — Encounter: Payer: Self-pay | Admitting: Internal Medicine

## 2024-02-29 DIAGNOSIS — E1159 Type 2 diabetes mellitus with other circulatory complications: Secondary | ICD-10-CM

## 2024-02-29 MED ORDER — VALSARTAN 160 MG PO TABS: 160.0000 mg | ORAL_TABLET | Freq: Every day | ORAL | 1 refills | Status: DC

## 2024-02-29 MED ORDER — AMLODIPINE BESYLATE 10 MG PO TABS: 10.0000 mg | ORAL_TABLET | Freq: Every day | ORAL | 1 refills | Status: AC

## 2024-03-23 IMAGING — CT CT HEAD W/O CM
4 series · 16 of 47 positions shown, 18 images · non-contrast
Comparison: None Available.

CLINICAL DATA: Headache



[Series 3: head wo · axial · 0.45mm/px · z∈[-148,-28]mm · 7 of 32 slices shown, 9 images]
[im 4/32  brain]
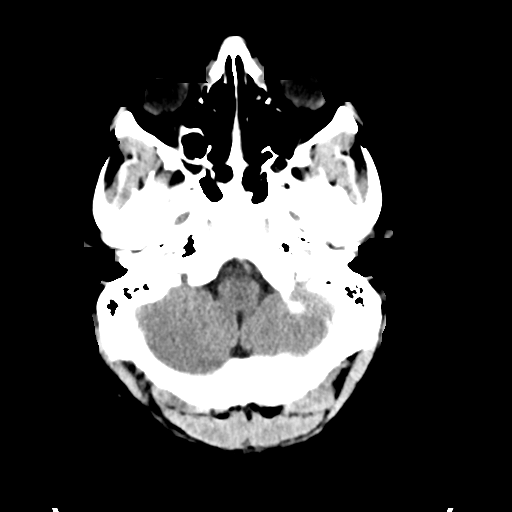
[im 4/32  bone]
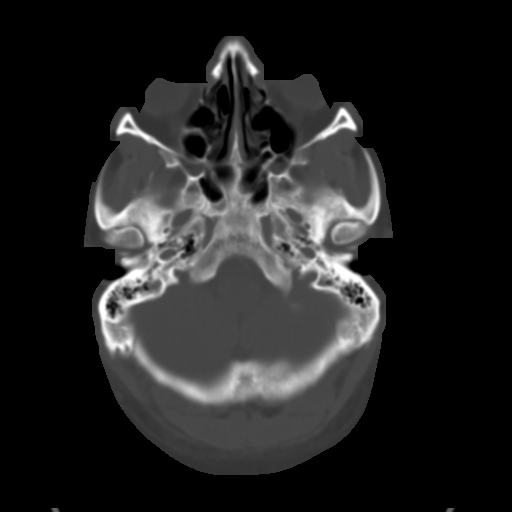
[im 8/32  brain]
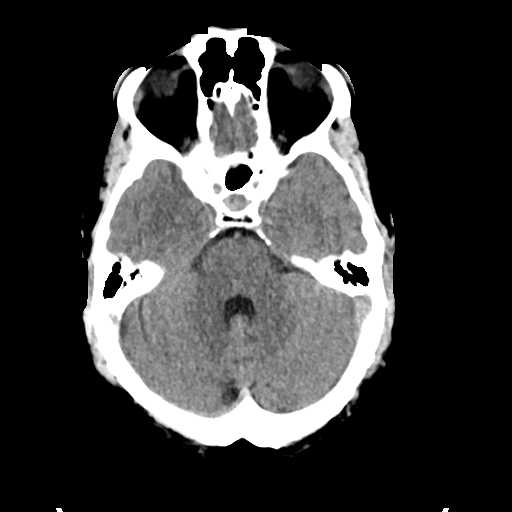
[im 12/32  brain]
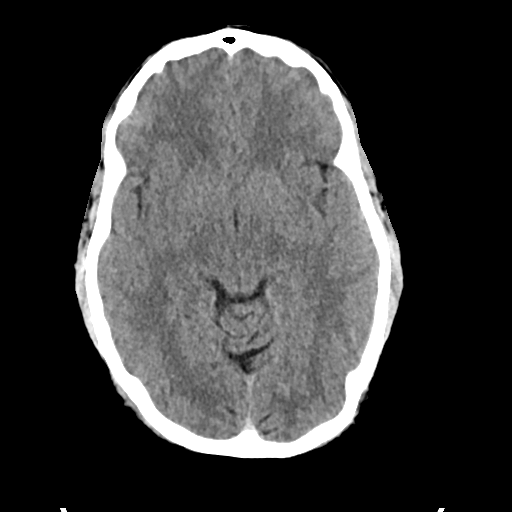
[im 16/32  brain]
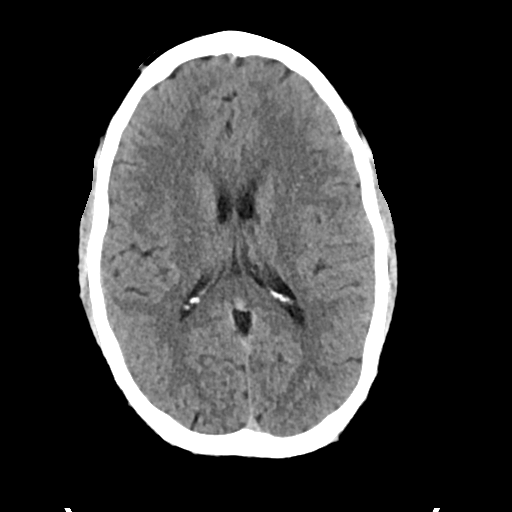
[im 20/32  brain]
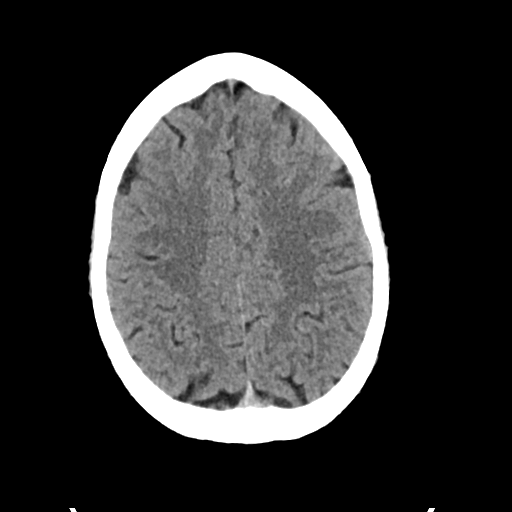
[im 20/32  bone]
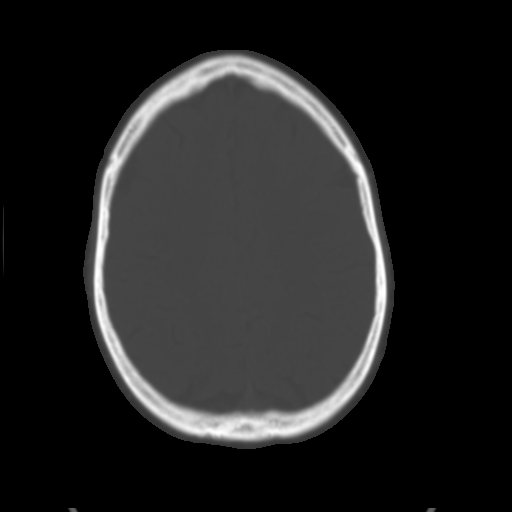
[im 24/32  brain]
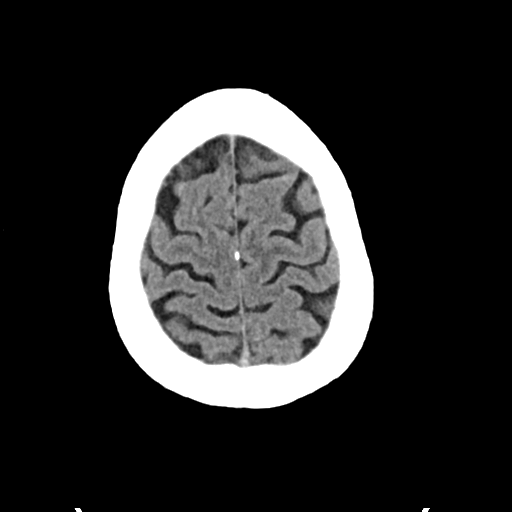
[im 28/32  brain]
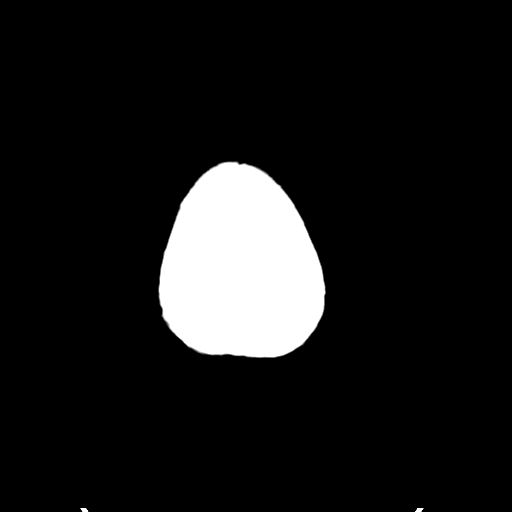

[Series 4: head bone · axial · 0.45mm/px · z∈[-149,-117]mm · 3 of 80 slices shown]
[im 8/80  bone]
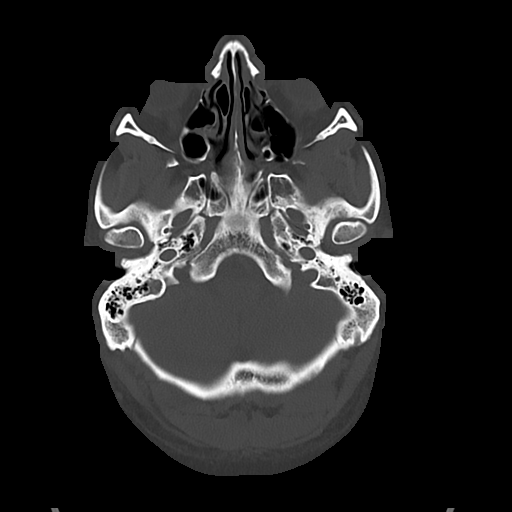
[im 16/80  bone]
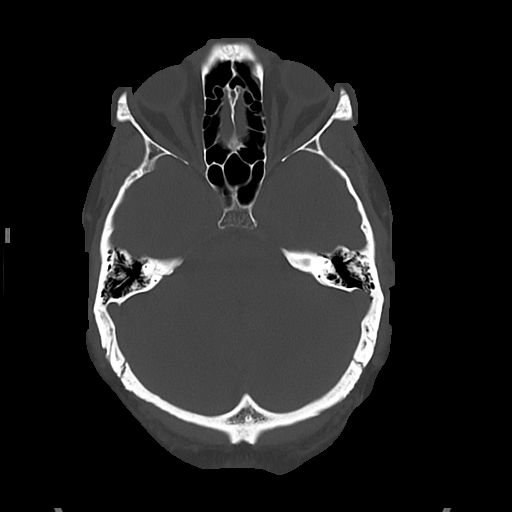
[im 24/80  bone]
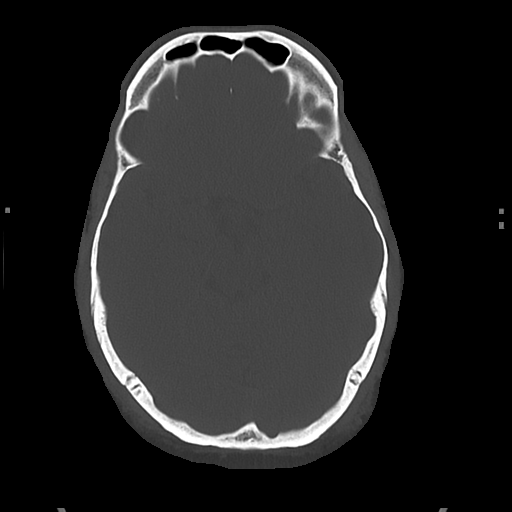

[Series 5: cor soft · coronal · 0.32mm/px · 3 of 72 slices shown]
[im 24/72  brain]
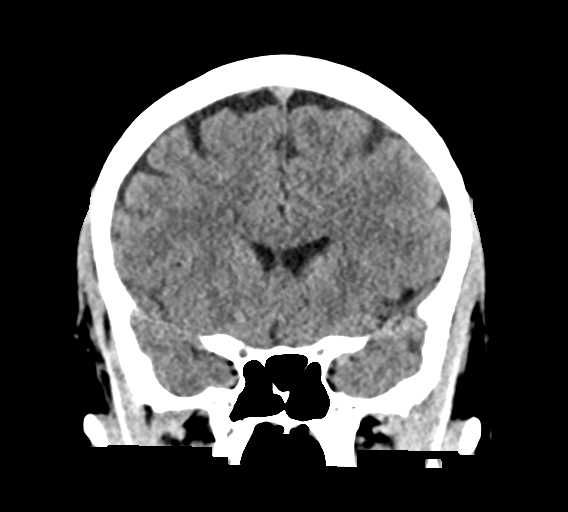
[im 32/72  brain]
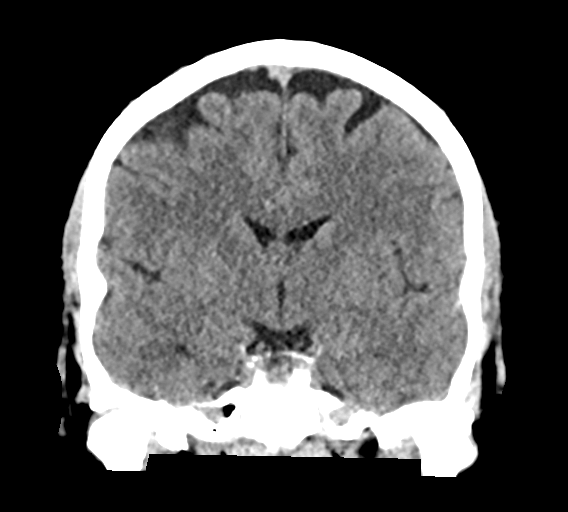
[im 40/72  brain]
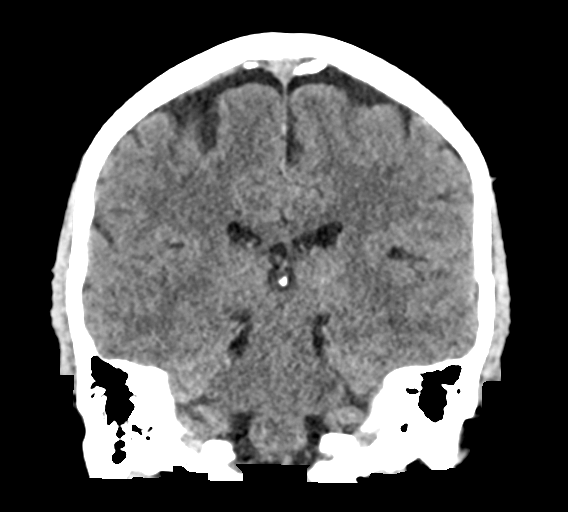

[Series 6: sag soft · sagittal · 0.34mm/px · 3 of 62 slices shown]
[im 21/62  brain]
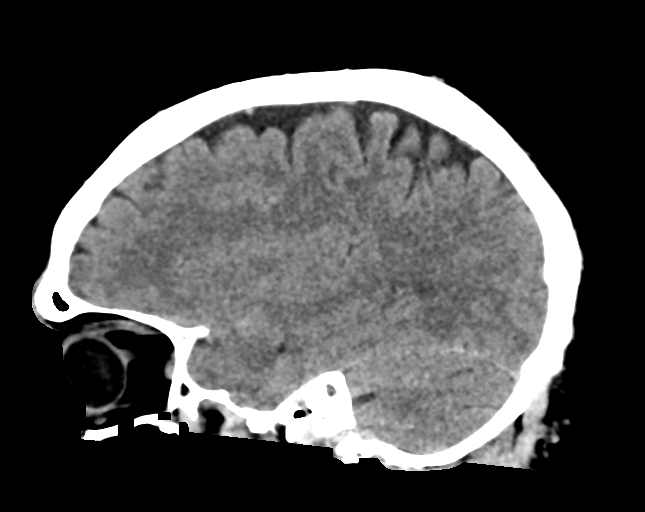
[im 31/62  brain]
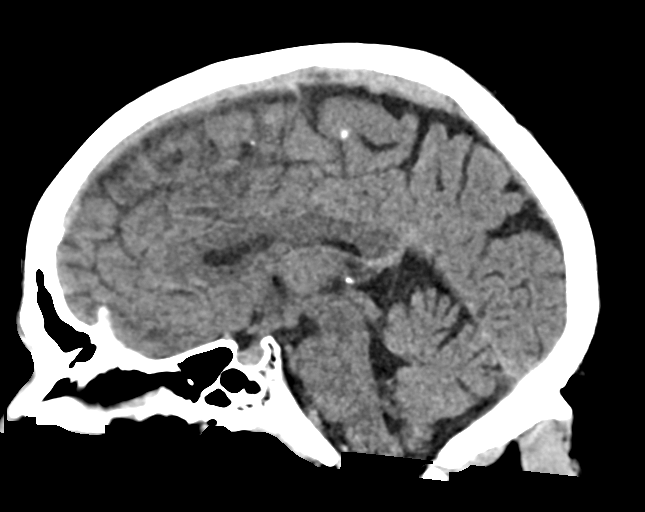
[im 41/62  brain]
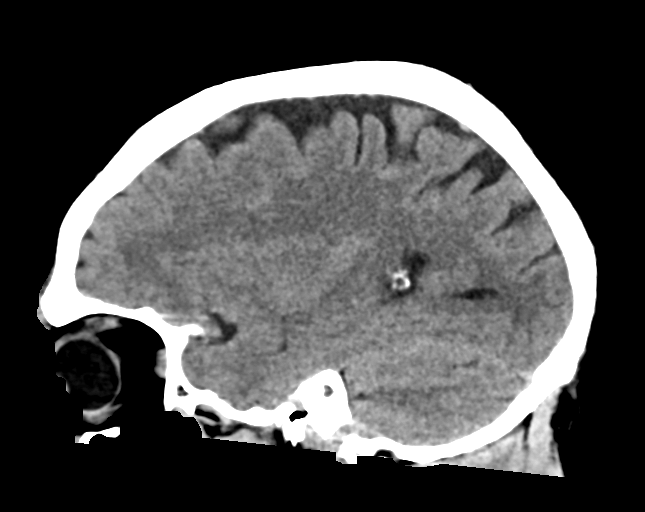

[16 of 47 positions shown; findings below may reference images not displayed]

FINDINGS: Brain: No acute intracranial hemorrhage, mass effect, or herniation.
No extra-axial fluid collections. No evidence of acute territorial
infarct. No hydrocephalus.

Vascular: No hyperdense vessel or unexpected calcification.

Skull: Normal. Negative for fracture or focal lesion.

Sinuses/Orbits: No acute finding.

Other: None.
IMPRESSION: No acute intracranial process identified.

## 2024-03-23 IMAGING — CT CT ANGIO HEAD
1 of 10 series · 5 of 33 positions shown · IV contrast (APPLIED)
Comparison: No prior CTA, correlation is made with 11/14/2021 CT
head

CLINICAL DATA: Headache, sudden/severe common carotid and artery
aneurysms suspected

EXAM:
CT ANGIOGRAPHY HEAD AND NECK
TECHNIQUE: Multidetector CT imaging of the head and neck was performed using
the standard protocol during bolus administration of intravenous
contrast. Multiplanar CT image reconstructions and MIPs were
obtained to evaluate the vascular anatomy. Carotid stenosis
measurements (when applicable) are obtained utilizing NASCET
criteria, using the distal internal carotid diameter as the
denominator.

[Series 6: cta neck/head · axial · 0.52mm/px · z∈[-304,-62]mm · 5 of 727 slices shown]
[im 122/727  soft-tissue]
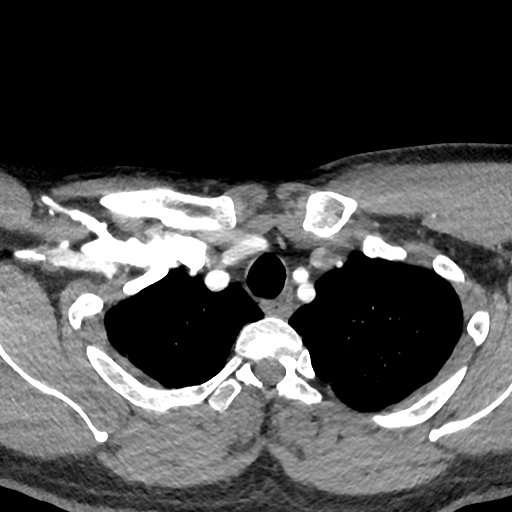
[im 243/727  bone]
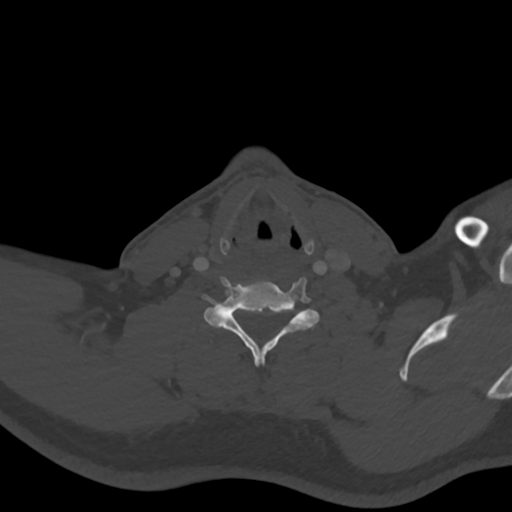
[im 364/727  soft-tissue]
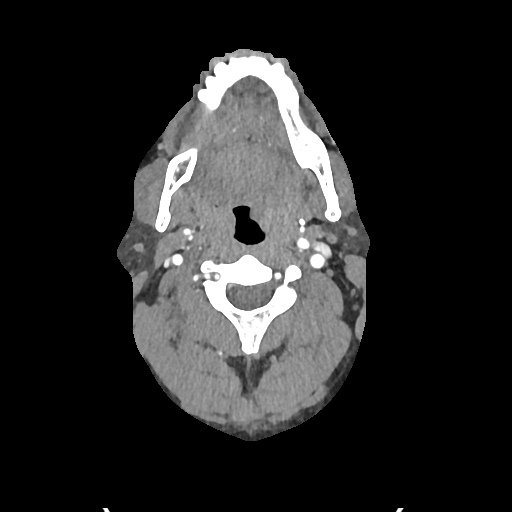
[im 485/727  bone]
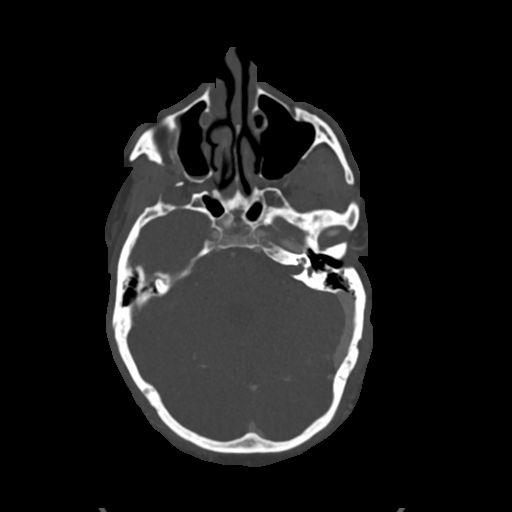
[im 606/727  soft-tissue]
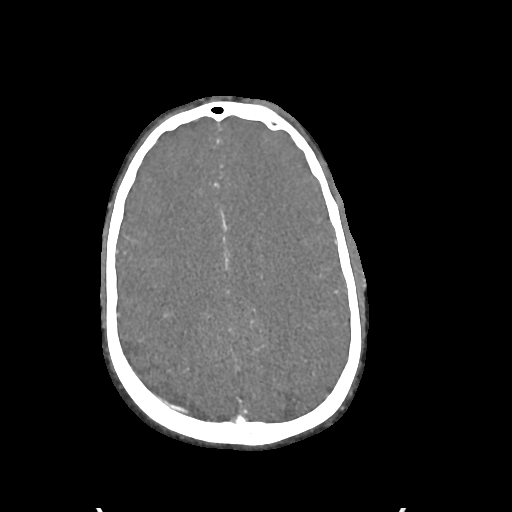

[5 of 33 positions shown; findings below may reference images not displayed]

RADIATION DOSE REDUCTION: This exam was performed according to the
departmental dose-optimization program which includes automated
exposure control, adjustment of the mA and/or kV according to
patient size and/or use of iterative reconstruction technique.

CONTRAST:  75mL OMNIPAQUE IOHEXOL 350 MG/ML SOLN
FINDINGS: CT HEAD FINDINGS

For noncontrast findings, please see same day CT head.

CTA NECK FINDINGS

Aortic arch: Standard branching. Imaged portion shows no evidence of
aneurysm or dissection. No significant stenosis of the major arch
vessel origins.

Right carotid system: No evidence of dissection, occlusion, or
hemodynamically significant stenosis (greater than 50%).

Left carotid system: No evidence of dissection, occlusion, or
hemodynamically significant stenosis (greater than 50%).

Vertebral arteries: No evidence of dissection, occlusion, or
hemodynamically significant stenosis (greater than 50%).

Skeleton: No acute osseous abnormality.

Other neck: Negative.

Upper chest: No focal pulmonary opacity or pleural effusion.

Review of the MIP images confirms the above findings

CTA HEAD FINDINGS

Anterior circulation: Both internal carotid arteries are patent to
the termini, without significant stenosis.

A1 segments patent. Normal anterior communicating artery. Anterior
cerebral arteries are patent to their distal aspects.

No M1 stenosis or occlusion. Normal MCA bifurcations. Distal MCA
branches perfused and symmetric.

Posterior circulation: Vertebral arteries patent to the
vertebrobasilar junction without stenosis. Posterior inferior
cerebral arteries patent bilaterally.

Basilar patent to its distal aspect. Superior cerebellar arteries
patent bilaterally.

Patent P1 segments, with a diminutive right P1. PCAs perfused to
their distal aspects without stenosis. The bilateral posterior
communicating arteries are visualized.

Venous sinuses: As permitted by contrast timing, patent.

Anatomic variants: None significant.

Review of the MIP images confirms the above findings
IMPRESSION: 1.  No intracranial large vessel occlusion or significant stenosis.
2.  No hemodynamically significant stenosis in the neck.
3. No evidence of aneurysm or dissection in the head or neck.

## 2024-03-23 IMAGING — CT CT ANGIO NECK
2 of 7 series · 8 of 33 positions shown · IV contrast (APPLIED)
Comparison: No prior CTA, correlation is made with 11/14/2021 CT
head

CLINICAL DATA: Headache, sudden/severe common carotid and artery
aneurysms suspected

EXAM:
CT ANGIOGRAPHY HEAD AND NECK
TECHNIQUE: Multidetector CT imaging of the head and neck was performed using
the standard protocol during bolus administration of intravenous
contrast. Multiplanar CT image reconstructions and MIPs were
obtained to evaluate the vascular anatomy. Carotid stenosis
measurements (when applicable) are obtained utilizing NASCET
criteria, using the distal internal carotid diameter as the
denominator.

[Series 5: cta neck/head · axial · 0.52mm/px · z∈[-244,-124]mm · 2 of 182 slices shown]
[im 61/182  soft-tissue]
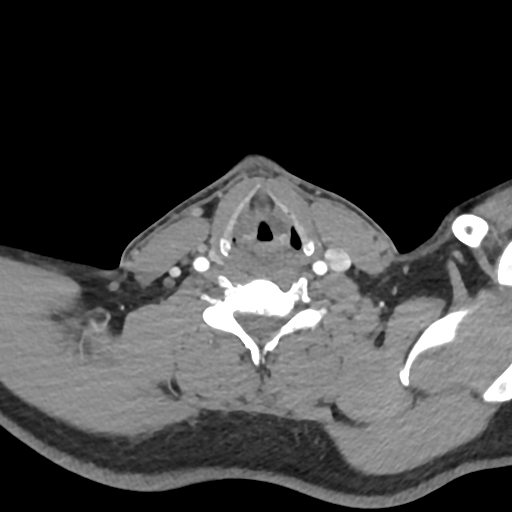
[im 121/182  soft-tissue]
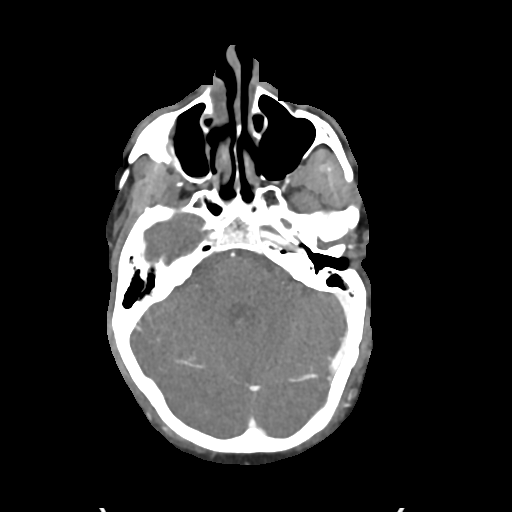

[Series 7: ax thins · axial · 0.45mm/px · z∈[-311,-52]mm · 6 of 363 slices shown]
[im 52/363  soft-tissue]
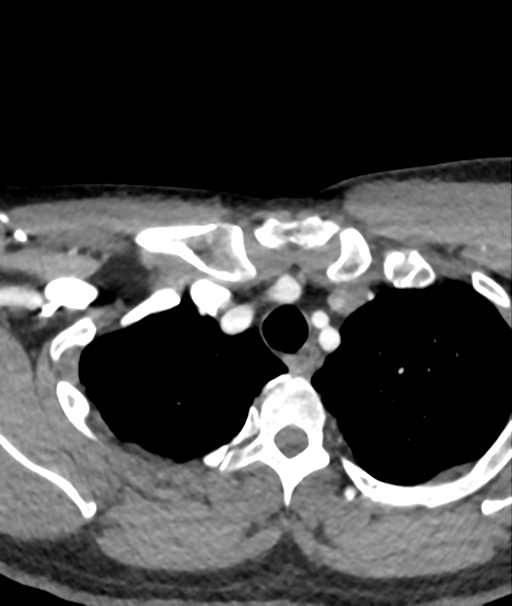
[im 104/363  bone]
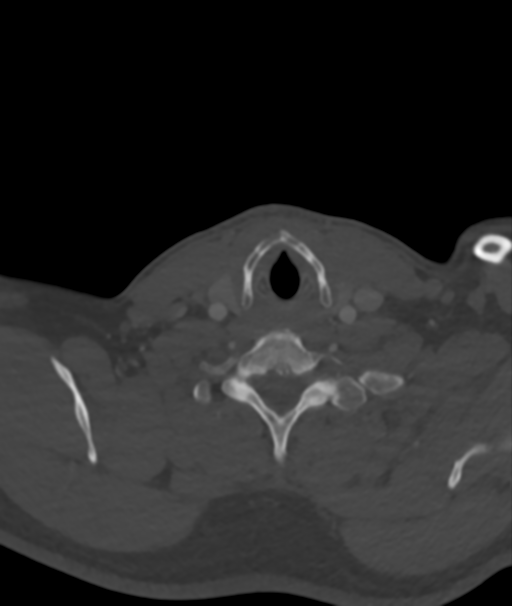
[im 156/363  soft-tissue]
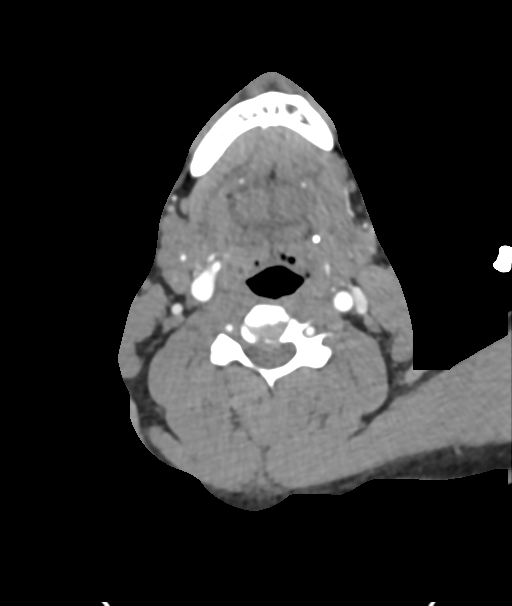
[im 207/363  bone]
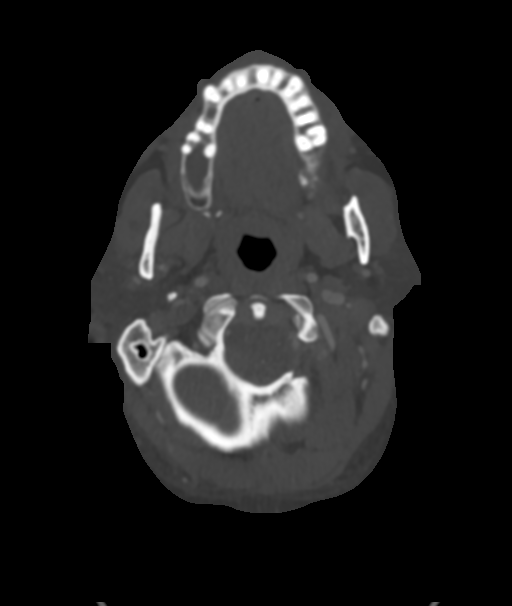
[im 259/363  soft-tissue]
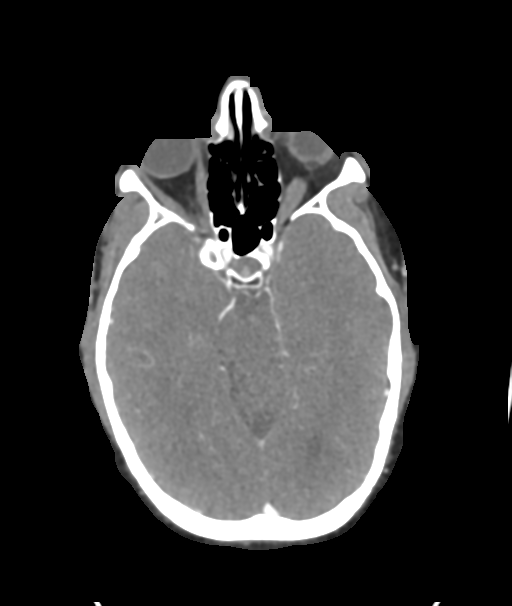
[im 311/363  bone]
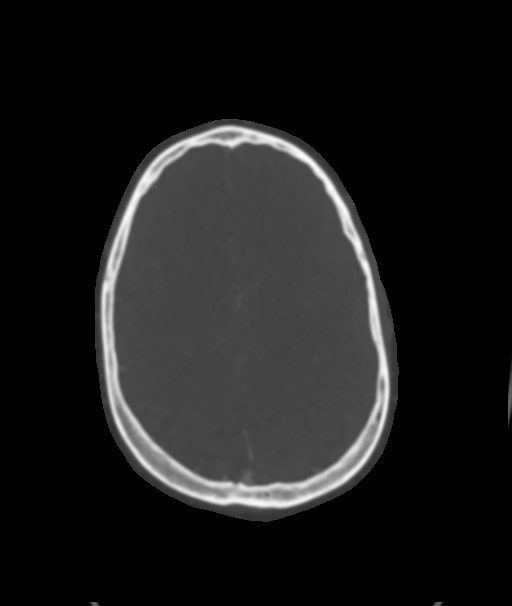

[8 of 33 positions shown; findings below may reference images not displayed]

RADIATION DOSE REDUCTION: This exam was performed according to the
departmental dose-optimization program which includes automated
exposure control, adjustment of the mA and/or kV according to
patient size and/or use of iterative reconstruction technique.

CONTRAST:  75mL OMNIPAQUE IOHEXOL 350 MG/ML SOLN
FINDINGS: CT HEAD FINDINGS

For noncontrast findings, please see same day CT head.

CTA NECK FINDINGS

Aortic arch: Standard branching. Imaged portion shows no evidence of
aneurysm or dissection. No significant stenosis of the major arch
vessel origins.

Right carotid system: No evidence of dissection, occlusion, or
hemodynamically significant stenosis (greater than 50%).

Left carotid system: No evidence of dissection, occlusion, or
hemodynamically significant stenosis (greater than 50%).

Vertebral arteries: No evidence of dissection, occlusion, or
hemodynamically significant stenosis (greater than 50%).

Skeleton: No acute osseous abnormality.

Other neck: Negative.

Upper chest: No focal pulmonary opacity or pleural effusion.

Review of the MIP images confirms the above findings

CTA HEAD FINDINGS

Anterior circulation: Both internal carotid arteries are patent to
the termini, without significant stenosis.

A1 segments patent. Normal anterior communicating artery. Anterior
cerebral arteries are patent to their distal aspects.

No M1 stenosis or occlusion. Normal MCA bifurcations. Distal MCA
branches perfused and symmetric.

Posterior circulation: Vertebral arteries patent to the
vertebrobasilar junction without stenosis. Posterior inferior
cerebral arteries patent bilaterally.

Basilar patent to its distal aspect. Superior cerebellar arteries
patent bilaterally.

Patent P1 segments, with a diminutive right P1. PCAs perfused to
their distal aspects without stenosis. The bilateral posterior
communicating arteries are visualized.

Venous sinuses: As permitted by contrast timing, patent.

Anatomic variants: None significant.

Review of the MIP images confirms the above findings
IMPRESSION: 1.  No intracranial large vessel occlusion or significant stenosis.
2.  No hemodynamically significant stenosis in the neck.
3. No evidence of aneurysm or dissection in the head or neck.

## 2024-04-06 ENCOUNTER — Other Ambulatory Visit: Payer: Self-pay

## 2024-04-06 ENCOUNTER — Emergency Department (HOSPITAL_COMMUNITY)
Admission: EM | Admit: 2024-04-06 | Discharge: 2024-04-06 | Disposition: A | Discharge status: Home / Self Care | Attending: Emergency Medicine | Admitting: Emergency Medicine

## 2024-04-06 ENCOUNTER — Encounter (HOSPITAL_COMMUNITY): Payer: Self-pay | Admitting: Emergency Medicine

## 2024-04-06 DIAGNOSIS — Z7984 Long term (current) use of oral hypoglycemic drugs: Secondary | ICD-10-CM | POA: Insufficient documentation

## 2024-04-06 DIAGNOSIS — I10 Essential (primary) hypertension: Secondary | ICD-10-CM | POA: Insufficient documentation

## 2024-04-06 DIAGNOSIS — G43109 Migraine with aura, not intractable, without status migrainosus: Secondary | ICD-10-CM | POA: Insufficient documentation

## 2024-04-06 DIAGNOSIS — R519 Headache, unspecified: Secondary | ICD-10-CM | POA: Diagnosis not present

## 2024-04-06 DIAGNOSIS — Z79899 Other long term (current) drug therapy: Secondary | ICD-10-CM | POA: Diagnosis not present

## 2024-04-06 DIAGNOSIS — J45909 Unspecified asthma, uncomplicated: Secondary | ICD-10-CM | POA: Insufficient documentation

## 2024-04-06 DIAGNOSIS — E119 Type 2 diabetes mellitus without complications: Secondary | ICD-10-CM | POA: Insufficient documentation

## 2024-04-06 LAB — CBC WITH DIFFERENTIAL/PLATELET
Abs Immature Granulocytes: 0.01 K/uL (ref 0.00–0.07)
Basophils Absolute: 0 K/uL (ref 0.0–0.1)
Basophils Relative: 1 %
Eosinophils Absolute: 0.1 K/uL (ref 0.0–0.5)
Eosinophils Relative: 2 %
HCT: 41.6 % (ref 39.0–52.0)
Hemoglobin: 14.8 g/dL (ref 13.0–17.0)
Immature Granulocytes: 0 %
Lymphocytes Relative: 33 %
Lymphs Abs: 2.1 K/uL (ref 0.7–4.0)
MCH: 32.3 pg (ref 26.0–34.0)
MCHC: 35.6 g/dL (ref 30.0–36.0)
MCV: 90.8 fL (ref 80.0–100.0)
Monocytes Absolute: 0.6 K/uL (ref 0.1–1.0)
Monocytes Relative: 10 %
Neutro Abs: 3.5 K/uL (ref 1.7–7.7)
Neutrophils Relative %: 54 %
Platelets: 223 K/uL (ref 150–400)
RBC: 4.58 MIL/uL (ref 4.22–5.81)
RDW: 11.8 % (ref 11.5–15.5)
WBC: 6.3 K/uL (ref 4.0–10.5)
nRBC: 0 % (ref 0.0–0.2)

## 2024-04-06 LAB — COMPREHENSIVE METABOLIC PANEL WITH GFR
ALT: 149 U/L — ABNORMAL HIGH (ref 0–44)
AST: 131 U/L — ABNORMAL HIGH (ref 15–41)
Albumin: 3.9 g/dL (ref 3.5–5.0)
Alkaline Phosphatase: 56 U/L (ref 38–126)
Anion gap: 13 (ref 5–15)
BUN: 11 mg/dL (ref 6–20)
CO2: 25 mmol/L (ref 22–32)
Calcium: 9.5 mg/dL (ref 8.9–10.3)
Chloride: 99 mmol/L (ref 98–111)
Creatinine, Ser: 0.97 mg/dL (ref 0.61–1.24)
GFR, Estimated: >60 mL/min (ref >60)
Glucose, Bld: 158 mg/dL — ABNORMAL HIGH (ref 70–99)
Potassium: 3.7 mmol/L (ref 3.5–5.1)
Sodium: 137 mmol/L (ref 135–145)
Total Bilirubin: 0.8 mg/dL (ref 0.0–1.2)
Total Protein: 7.7 g/dL (ref 6.5–8.1)

## 2024-04-06 MED ORDER — LIDOCAINE-EPINEPHRINE (PF) 2 %-1:200000 IJ SOLN: 10.0000 mL | Freq: Once | INTRAMUSCULAR | Status: DC

## 2024-04-06 MED ORDER — TETANUS-DIPHTH-ACELL PERTUSSIS 5-2-15.5 LF-MCG/0.5 IM SUSP: 0.5000 mL | Freq: Once | INTRAMUSCULAR | Status: DC

## 2024-04-06 MED ORDER — METOCLOPRAMIDE HCL 10 MG PO TABS
10.0000 mg | ORAL_TABLET | Freq: Four times a day (QID) | ORAL | 0 refills | Status: DC
Start: 2024-04-06 — End: 2024-05-24

## 2024-04-06 MED ORDER — PROCHLORPERAZINE EDISYLATE 10 MG/2ML IJ SOLN
10.0000 mg | Freq: Once | INTRAMUSCULAR | Status: AC
  Administered 2024-04-06: 10 mg via INTRAVENOUS
  Filled 2024-04-06: qty 2

## 2024-04-06 MED ORDER — DIPHENHYDRAMINE HCL 50 MG/ML IJ SOLN
25.0000 mg | Freq: Once | INTRAMUSCULAR | Status: AC
  Administered 2024-04-06: 25 mg via INTRAVENOUS
  Filled 2024-04-06: qty 1

## 2024-04-06 MED ORDER — LACTATED RINGERS IV BOLUS
500.0000 mL | Freq: Once | INTRAVENOUS | Status: AC
  Administered 2024-04-06: 500 mL via INTRAVENOUS

## 2024-04-06 MED ORDER — DEXAMETHASONE SODIUM PHOSPHATE 10 MG/ML IJ SOLN
10.0000 mg | Freq: Once | INTRAMUSCULAR | Status: AC
  Administered 2024-04-06: 10 mg via INTRAVENOUS
  Filled 2024-04-06: qty 1

## 2024-04-06 MED ORDER — KETOROLAC TROMETHAMINE 15 MG/ML IJ SOLN
15.0000 mg | Freq: Once | INTRAMUSCULAR | Status: AC
  Administered 2024-04-06: 15 mg via INTRAVENOUS
  Filled 2024-04-06: qty 1

## 2024-04-06 NOTE — Discharge Instructions (Addendum)
 You are seen today for migraine headache.  Recommend continued follow-up with your PCP for further management of this as with this noting to be a common occurrence you may wish to have medications to use for abortive/rescue therapy.  I am sending in a short course of antinausea and headache medications for you.  Return to the ED for any new or worsening symptoms which include One-sided weakness, vision changes, vertigo, persistent vomiting.

## 2024-04-06 NOTE — ED Triage Notes (Addendum)
 Patient with migraine pain x1 day intermittently. Pain concentrated over the R side of head in the temple area. Endorses photosensitivity, nausea and emesis. Hx of migraine. Pain today is worse than his usual migraine pain. Not currently on any medication for migraines. Tried Excedrin at home with no relief.

## 2024-04-06 NOTE — ED Provider Notes (Signed)
 [facility] AT [facility] Note   CSN: [id number] Arrival date & time: 04/06/24  9378     Patient presents with: Migraine   Stephen Lozano is a 53 y.o. male.    Migraine Associated symptoms include headaches.  Patient is a 53 year old male presenting ED today for concerns for acute on chronic migraine headache.  Noted that he had aura of flashing lights approximate 1 week ago when symptoms started while he was at work.  Reporting light and sound sensitivity, worse on exertion noted to have headache, right sided, pulsatile.  Since then has had peaks and valleys where with the last 2 days have been unabated despite Excedrin use.  Notes he has had nausea and vomiting with last episode of emesis approximately 3.5 hours ago.  Previous medical history of HTN, asthma, migraines, type 2 diabetes, hepatic steatosis with abnormal LFTs  Denies blurry vision, vertigo, tinnitus, otalgia, dysphagia, odynophagia, chest pain, shortness of breath, abdominal pain, dysuria, diarrhea, leg swelling.     Prior to Admission medications   Medication Sig Start Date End Date Taking? Authorizing Provider  metoCLOPramide (REGLAN) 10 MG tablet Take 1 tablet (10 mg total) by mouth every 6 (six) hours. 04/06/24  Yes [name] S, PA-C  albuterol  (VENTOLIN  HFA) 108 (90 Base) MCG/ACT inhaler Inhale 2 puffs into the lungs every 6 (six) hours as needed for wheezing or shortness of breath. 09/05/22   [name], MD  amLODipine  (NORVASC ) 10 MG tablet Take 1 tablet (10 mg total) by mouth daily. 02/29/24   [name], MD  erythromycin  ophthalmic ointment Place a 1/2 inch ribbon of ointment into the lower right eyelid 4x daily for 5 days Patient not taking: Reported on 01/22/2024 11/02/23   [name], Stephen Lozano  N, FNP  metFORMIN  (GLUCOPHAGE ) 1000 MG tablet Take 1 tablet (1,000 mg total) by mouth 2 (two) times daily with a meal. 09/18/23   [name], MD  Multiple  Vitamins-Minerals (MULTIVITAMIN WITH MINERALS) tablet Take 1 tablet by mouth daily.    [provider]  valsartan  (DIOVAN ) 160 MG tablet Take 1 tablet (160 mg total) by mouth daily. 02/29/24   [name], MD    Allergies: Shellfish allergy    Review of Lozano  Gastrointestinal:  Positive for nausea and vomiting.  Neurological:  Positive for headaches.  All other Lozano reviewed and are negative.   Updated Vital Signs BP (!) 153/104 (BP Location: Right Arm)   Pulse 80   Temp 98 F (36.7 C) (Oral)   Resp 18   Ht 6' (1.829 m)   Wt 95 kg   SpO2 99%   BMI 28.40 kg/m   Physical Exam Vitals and nursing note reviewed.  Constitutional:      General: He is not in acute distress.    Appearance: Normal appearance. He is not ill-appearing or diaphoretic.  HENT:     Head: Normocephalic and atraumatic.     Right Ear: Tympanic membrane, ear canal and external ear normal.     Left Ear: Tympanic membrane, ear canal and external ear normal.     Mouth/Throat:     Mouth: Mucous membranes are moist.     Pharynx: Oropharynx is clear. No oropharyngeal exudate or posterior oropharyngeal erythema.  Eyes:     General: No scleral icterus.       Right eye: No discharge.        Left eye: No discharge.     Extraocular Movements: Extraocular movements  intact.     Conjunctiva/sclera: Conjunctivae normal.  Cardiovascular:     Rate and Rhythm: Normal rate and regular rhythm.     Pulses: Normal pulses.     Heart sounds: Normal heart sounds. No murmur heard.    No friction rub. No gallop.  Pulmonary:     Effort: Pulmonary effort is normal. No respiratory distress.     Breath sounds: No stridor. No wheezing, rhonchi or rales.  Chest:     Chest wall: No tenderness.  Abdominal:     General: Abdomen is flat. There is no distension.     Palpations: Abdomen is soft.     Tenderness: There is no abdominal tenderness. There is no right CVA tenderness, left CVA tenderness, guarding or  rebound.  Musculoskeletal:        General: No swelling, deformity or signs of injury.     Cervical back: Normal range of motion. No rigidity.     Right lower leg: No edema.     Left lower leg: No edema.  Skin:    General: Skin is warm and dry.     Findings: No bruising, erythema or lesion.  Neurological:     General: No focal deficit present.     Mental Status: He is alert and oriented to person, place, and time. Mental status is at baseline.     Sensory: No sensory deficit.     Motor: No weakness.     Comments: No facial asymmetry, no ataxia, no apraxia, no aphasia, no arm drift, normal coordination with finger-to-nose, normal sensation to both upper and lower extremities bilaterally, normal grip strength bilaterally, normal strength to both flexion and extension to both upper lower extremities 5+ bilaterally, no visual field deficits, no nystagmus.   Psychiatric:        Mood and Affect: Mood normal.     (all labs ordered are listed, but only abnormal results are displayed) Labs Reviewed  COMPREHENSIVE METABOLIC PANEL WITH GFR - Abnormal; Notable for the following components:      Result Value   Glucose, Bld 158 (*)    AST 131 (*)    ALT 149 (*)    All other components within normal limits  CBC WITH DIFFERENTIAL/PLATELET    EKG: None  Radiology: No results found.  Procedures   Medications Ordered in the ED  lactated ringers bolus 500 mL (500 mLs Intravenous New Bag/Given 04/06/24 0853)  ketorolac  (TORADOL ) 15 MG/ML injection 15 mg (15 mg Intravenous Given 04/06/24 0839)  prochlorperazine  (COMPAZINE ) injection 10 mg (10 mg Intravenous Given 04/06/24 0834)  diphenhydrAMINE (BENADRYL) injection 25 mg (25 mg Intravenous Given 04/06/24 0836)  dexamethasone (DECADRON) injection 10 mg (10 mg Intravenous Given 04/06/24 9161)                                    Medical Decision Making Amount and/or Complexity of Data Reviewed Labs: ordered.  Risk Prescription drug  management.   This patient is a 53 year old male who presents to the ED for concern of migraine headache, similar to previous migraine headaches without any new changes needing to have had episodes of nausea and vomiting accompanied with this secondary to the headache.   On physical exam, patient is in no acute distress, afebrile, alert and orient x 4, speaking in full sentences, nontachypneic, nontachycardic.  Noted to have normal neuroexam, normal ENT exam.  LCTAB, RRR, no abdominal tenderness to palpation,  no lower leg edema.  With patient's care physician, will provide migraine cocktail with low suspicion for central causes of patient's symptoms.  Considered CT but not believe it is warranted at this time.  Will trial home medications and reevaluate.  On reevaluation, patient symptoms have abated.  Send he feels back to normal.  Will send home with Reglan to use as needed.  Will have him follow-up with PCP.  Patient vital signs have remained stable throughout the course of patient's time in the ED. Low suspicion for any other emergent pathology at this time. I believe this patient is safe to be discharged. Provided strict return to ER precautions. Patient expressed agreement and understanding of plan. All questions were answered.  Differential diagnoses prior to evaluation: The emergent differential diagnosis includes, but is not limited to, tension headache, migraine, polypharmacy, substance abuse, sinusitis, cervicogenic headache, dehydration, cluster headache, trigeminal neuralgia, IIH, PRES syndrome, intracranial bleed, CVA. This is not an exhaustive differential.   Past Medical History / Co-morbidities / Social History: HTN, asthma, migraines, type 2 diabetes, hepatic steatosis with abnormal LFTs  Additional history: Chart reviewed. Pertinent results include:  Last seen by PCP on 01/22/2024  Lab Tests/Imaging studies: I personally interpreted labs/imaging and the pertinent results  include:    CBC unremarkable CMP notes elevated ALT AST similar to previous congruent with hepatic steatosis    Medications: I ordered medication including Toradol , Compazine , Benadryl, LR, Decadron.  I have reviewed the patients home medicines and have made adjustments as needed.  Critical Interventions: None  Social Determinants of Health: None  Disposition: After consideration of the diagnostic results and the patients response to treatment, I feel that the patient would benefit from discharge and show as above.   emergency Lozano workup does not suggest an emergent condition requiring admission or immediate intervention beyond what has been performed at this time. The plan is: Follow-up with PCP as needed, return to the ED for any new or worsening symptoms. The patient is safe for discharge and has been instructed to return immediately for worsening symptoms, change in symptoms or any other concerns.   Final diagnoses:  Migraine with aura and without status migrainosus, not intractable    ED Discharge Orders          Ordered    metoCLOPramide (REGLAN) 10 MG tablet  Every 6 hours        04/06/24 0950               Beola Terrall RAMAN, PA-C 04/06/24 9048    Armenta Canning, MD 04/07/24 1715

## 2024-05-23 ENCOUNTER — Telehealth: Payer: Self-pay | Admitting: Internal Medicine

## 2024-05-23 NOTE — Telephone Encounter (Signed)
 Confirmed appt for 11/18

## 2024-05-24 ENCOUNTER — Ambulatory Visit: Attending: Internal Medicine | Admitting: Internal Medicine

## 2024-05-24 ENCOUNTER — Encounter: Payer: Self-pay | Admitting: Internal Medicine

## 2024-05-24 VITALS — BP 151/77 | HR 76 | Temp 98.1°F | Ht 72.0 in | Wt 208.0 lb

## 2024-05-24 DIAGNOSIS — E782 Mixed hyperlipidemia: Secondary | ICD-10-CM | POA: Diagnosis not present

## 2024-05-24 DIAGNOSIS — E1159 Type 2 diabetes mellitus with other circulatory complications: Secondary | ICD-10-CM

## 2024-05-24 DIAGNOSIS — Z79899 Other long term (current) drug therapy: Secondary | ICD-10-CM

## 2024-05-24 DIAGNOSIS — K760 Fatty (change of) liver, not elsewhere classified: Secondary | ICD-10-CM | POA: Diagnosis not present

## 2024-05-24 DIAGNOSIS — R7989 Other specified abnormal findings of blood chemistry: Secondary | ICD-10-CM

## 2024-05-24 DIAGNOSIS — Z7984 Long term (current) use of oral hypoglycemic drugs: Secondary | ICD-10-CM

## 2024-05-24 DIAGNOSIS — E119 Type 2 diabetes mellitus without complications: Secondary | ICD-10-CM

## 2024-05-24 DIAGNOSIS — I152 Hypertension secondary to endocrine disorders: Secondary | ICD-10-CM

## 2024-05-24 DIAGNOSIS — E1169 Type 2 diabetes mellitus with other specified complication: Secondary | ICD-10-CM | POA: Diagnosis not present

## 2024-05-24 LAB — POCT GLYCOSYLATED HEMOGLOBIN (HGB A1C): HbA1c, POC (controlled diabetic range): 6.9 % (ref 0.0–7.0)

## 2024-05-24 LAB — GLUCOSE, POCT (MANUAL RESULT ENTRY): POC Glucose: 159 mg/dL — AB (ref 70–99)

## 2024-05-24 MED ORDER — METFORMIN HCL 1000 MG PO TABS: 1000.0000 mg | ORAL_TABLET | Freq: Two times a day (BID) | ORAL | 2 refills | Status: AC

## 2024-05-24 MED ORDER — VALSARTAN 320 MG PO TABS: 320.0000 mg | ORAL_TABLET | Freq: Every day | ORAL | 3 refills | Status: AC

## 2024-05-24 NOTE — Progress Notes (Signed)
 Patient ID: Stephen Lozano, male    DOB: [birth year]  MRN: [id number]  CC: Diabetes (DM & HTN f/u. Med refill. /No questions / concerns/No to all vax)   Subjective: Stephen Lozano is a 53 y.o. male who presents for chronic ds management. His concerns today include:  Patient with history of HTN, mild intermittent asthma, DM type 2, HL, abn LFTs, hepatic steatosis, recurrent pericarditis.   Discussed the use of AI scribe software for clinical note transcription with the patient, who gave verbal consent to proceed.  History of Present Illness   Stephen Lozano is a 52 year old male with diabetes and hypertension who presents for follow-up of his chronic conditions.  DM: Results for orders placed or performed in visit on 05/24/24  POCT glycosylated hemoglobin (Hb A1C)   Collection Time: 05/24/24  4:25 PM  Result Value Ref Range   Hemoglobin A1C     HbA1c POC (<> result, manual entry)     HbA1c, POC (prediabetic range)     HbA1c, POC (controlled diabetic range) 6.9 0.0 - 7.0 %  POCT glucose (manual entry)   Collection Time: 05/24/24  4:26 PM  Result Value Ref Range   POC Glucose 159 (A) 70 - 99 mg/dl   His hemoglobin J8r has improved to 6.9%, with previous values of 7.9%, 7.4%, 7.2%, and 7.0%. He continues to take metformin  1000 mg twice daily. He occasionally drink a soda and consumes foods like jerk chicken, rice, peas, and ginger ale which he had for lunch today but generally avoids soda due to its sugar content.  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 He prefers walking over using a stationary bike. He had an eye exam recently, and the ophthalmologist did not find any diabetic retinopathy. The exam was conducted at [facility].  HTN: His blood pressure today was 151/77 mmHg. He is currently on valsartan  160 mg and amlodipine  10 mg daily and confirms adherence to  these medications. He checks his blood pressure at least twice a week, with readings typically ranging from 135-140 mmHg systolic and below 80 mmHg diastolic for the past month or two. No chest pain or shortness of breath. He reports no salt intake in his diet.  His liver function tests, specifically AST and ALT, remain elevated but are not as high as in the past. His recent blood cell counts were normal. Has fatty liver/MASLD.     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 18 g 6   amLODipine  (NORVASC ) 10 MG tablet Take 1 tablet (10 mg total) by mouth daily. 90 tablet 1   Multiple Vitamins-Minerals (MULTIVITAMIN WITH MINERALS) tablet Take 1 tablet by mouth daily.     No current facility-administered medications on file prior to visit.    Allergies  Allergen Reactions   Shellfish Allergy Anaphylaxis    Social History   Socioeconomic History   Marital status: Single    Spouse name: Not on file   Number of children: 6   Years  of education: Not on file   Highest education level: Bachelor's degree (e.g., BA, AB, BS)  Occupational History   Occupation: Therapist, nutritional  Tobacco Use   Smoking status: Never   Smokeless tobacco: Never  Vaping Use   Vaping status: Some Days   Devices: only when drinking  Substance and Sexual Activity   Alcohol use: Yes    Comment: occasional   Drug use: Never   Sexual activity: Yes  Other Topics Concern   Not on file  Social History Narrative   Not on file   Social Drivers of Health   Financial Resource Strain: Low Risk  (01/21/2024)   Overall Financial  Resource Strain (CARDIA)    Difficulty of Paying Living Expenses: Not hard at all  Food Insecurity: No Food Insecurity (05/24/2024)   Hunger Vital Sign    Worried About Running Out of Food in the Last Year: Never true    Ran Out of Food in the Last Year: Never true  Transportation Needs: No Transportation Needs (05/24/2024)   PRAPARE - Administrator, Civil Service (Medical): No    Lack of Transportation (Non-Medical): No  Physical Activity: Insufficiently Active (05/24/2024)   Exercise Vital Sign    Days of Exercise per Week: 2 days    Minutes of Exercise per Session: 10 min  Stress: No Stress Concern Present (05/24/2024)   [employer] of Occupational Health - Occupational Stress Questionnaire    Feeling of Stress: Not at all  Social Connections: Socially Integrated (05/24/2024)   Social Connection and Isolation Panel    Frequency of Communication with Friends and Family: More than three times a week    Frequency of Social Gatherings with Friends and Family: Twice a week    Attends Religious Services: More than 4 times per year    Active Member of [employer] or Organizations: Yes    Attends Engineer, Structural: More than 4 times per year    Marital Status: Married  Catering Manager Violence: Not At Risk (05/24/2024)   Humiliation, Afraid, Rape, and Kick questionnaire    Fear of Current or Ex-Partner: No    Emotionally Abused: No    Physically Abused: No    Sexually Abused: No    Family History  Problem Relation Age of Onset   Diabetes Mother    Diabetes Father    Hypertension Maternal Aunt    Hypertension Maternal Uncle    Colon cancer Neg Hx    Esophageal cancer Neg Hx     Past Surgical History:  Procedure Laterality Date   APPENDECTOMY     INGUINAL HERNIA REPAIR Right     ROS: Review of Systems Negative except as stated above  PHYSICAL EXAM: BP (!) 151/77 (BP Location: Left Arm, Patient Position: Sitting, Cuff Size: Normal)   Pulse 76    Temp 98.1 F (36.7 C) (Oral)   Ht 6' (1.829 m)   Wt 208 lb (94.3 kg)   SpO2 96%   BMI 28.21 kg/m   Wt Readings from Last 3 Encounters:  05/24/24 208 lb (94.3 kg)  04/06/24 209 lb 7 oz (95 kg)  01/22/24 210 lb (95.3 kg)    Physical Exam   General appearance - alert, well appearing, and in no distress Mental status - normal mood, behavior, speech, dress, motor activity, and thought processes Chest - clear to auscultation, no wheezes, rales or rhonchi, symmetric air entry Heart - normal rate, regular rhythm, normal S1, S2, no murmurs, rubs, clicks  or gallops Extremities - peripheral pulses normal, no pedal edema, no clubbing or cyanosis   Fibrosis 4 Score = 2.65 (Indeterminate)       Interpretation for patients with NAFLD          <1.30       -  F0-F1 (Low risk)          1.30-2.67 -  Indeterminate           >2.67      -  F3-F4 (High risk)     Validated for ages 44-65           Latest Ref Rng & Units 04/06/2024    6:29 AM 01/22/2024    4:10 PM 12/18/2023    3:47 PM  CMP  Glucose 70 - 99 mg/dL 841   884   BUN 6 - 20 mg/dL 11   16   Creatinine 9.38 - 1.24 mg/dL 9.02   8.86   Sodium 864 - 145 mmol/L 137   139   Potassium 3.5 - 5.1 mmol/L 3.7   3.9   Chloride 98 - 111 mmol/L 99   99   CO2 22 - 32 mmol/L 25   23   Calcium 8.9 - 10.3 mg/dL 9.5   89.9   Total Protein 6.5 - 8.1 g/dL 7.7  7.9  7.9   Total Bilirubin 0.0 - 1.2 mg/dL 0.8  0.9  0.8   Alkaline Phos 38 - 126 U/L 56  63  66   AST 15 - 41 U/L 131  104  152   ALT 0 - 44 U/L 149  128  231    Lipid Panel     Component Value Date/Time   CHOL 237 (H) 05/15/2023 1645   TRIG 164 (H) 05/15/2023 1645   HDL 49 05/15/2023 1645   CHOLHDL 4.8 05/15/2023 1645   LDLCALC 158 (H) 05/15/2023 1645    CBC    Component Value Date/Time   WBC 6.3 04/06/2024 0629   RBC 4.58 04/06/2024 0629   HGB 14.8 04/06/2024 0629   HCT 41.6 04/06/2024 0629   PLT 223 04/06/2024 0629   MCV 90.8 04/06/2024 0629   MCH 32.3 04/06/2024 0629    MCHC 35.6 04/06/2024 0629   RDW 11.8 04/06/2024 0629   LYMPHSABS 2.1 04/06/2024 0629   MONOABS 0.6 04/06/2024 0629   EOSABS 0.1 04/06/2024 0629   BASOSABS 0.0 04/06/2024 0629    ASSESSMENT AND PLAN: 1. DM type 2 with diabetic mixed hyperlipidemia (HCC) (Primary) At goal.  Continue metformin  1 g twice a day.  Encouraged him to continue trying to eat healthy and continue to stay active. - POCT glucose (manual entry) - POCT glycosylated hemoglobin (Hb A1C) - metFORMIN  (GLUCOPHAGE ) 1000 MG tablet; Take 1 tablet (1,000 mg total) by mouth 2 (two) times daily with a meal.  Dispense: 180 tablet; Refill: 2 - Microalbumin / creatinine urine ratio  2. Diabetes mellitus treated with oral medication (HCC) See #1 above  3. Hypertension associated with type 2 diabetes mellitus (HCC) Not at goal.  Recommend increase valsartan  to 320 mg daily.  Continue Norvasc  10 mg daily.  Continue to monitor blood pressure. - valsartan  (DIOVAN ) 320 MG tablet; Take 1 tablet (320 mg total) by mouth daily.  Dispense: 90 tablet; Refill: 3  4. Abnormal LFTs 5. Metabolic dysfunction-associated steatotic liver disease (MASLD) Patient with persistent elevation in AST ALT with known fatty liver.  Seen in the past by gastroenterology.  At some point we need  to consider getting an elastography.  Will discuss on subsequent visit.     Patient was given the opportunity to ask questions.  Patient verbalized understanding of the plan and was able to repeat key elements of the plan.   This documentation was completed using Paediatric nurse.  Any transcriptional errors are unintentional.  Orders Placed This Encounter  Procedures   Microalbumin / creatinine urine ratio   POCT glucose (manual entry)   POCT glycosylated hemoglobin (Hb A1C)     Requested Prescriptions   Signed Prescriptions Disp Refills   metFORMIN  (GLUCOPHAGE ) 1000 MG tablet 180 tablet 2    Sig: Take 1 tablet (1,000 mg total) by mouth 2  (two) times daily with a meal.   valsartan  (DIOVAN ) 320 MG tablet 90 tablet 3    Sig: Take 1 tablet (320 mg total) by mouth daily.    Return in about 4 months (around 09/21/2024) for Sign release to get eye exam from [facility] .  Barnie Louder, MD, FACP

## 2024-05-24 NOTE — Patient Instructions (Signed)
  VISIT SUMMARY: During your visit today, we reviewed your chronic conditions, including diabetes and hypertension. Your hemoglobin A1c has improved, and your recent eye exam showed no signs of diabetic retinopathy. We also discussed your blood pressure readings and liver function tests.  YOUR PLAN: -TYPE 2 DIABETES MELLITUS: Type 2 diabetes is a condition where your body does not use insulin properly, leading to high blood sugar levels. Your glycemic control has improved with a hemoglobin A1c of 6.9%. Continue taking metformin  1000 mg twice daily. We have requested the release of your eye exam records from [facility] and obtained a urine sample to check for proteinuria.  -HYPERTENSION: Hypertension, or high blood pressure, is when the force of your blood against your artery walls is too high. Your home readings are 135-140/80 mmHg, but your office readings are higher. To improve control, we have increased your valsartan  dose to 320 mg daily and discontinued the 160 mg dose. The updated prescription has been sent to your pharmacy.  -FATTY LIVER DISEASE: Fatty liver disease is a condition where fat builds up in your liver, causing elevated liver enzymes. We will continue to monitor your liver function tests.  INSTRUCTIONS: Please follow up with the lab to check for proteinuria and continue monitoring your blood pressure at home. Ensure you take the updated dose of valsartan  as prescribed. We will keep an eye on your liver function tests and review the results at your next visit.                      Contains text generated by [employer].                                 Contains text generated by [employer].

## 2024-05-25 DIAGNOSIS — E1169 Type 2 diabetes mellitus with other specified complication: Secondary | ICD-10-CM | POA: Diagnosis not present

## 2024-05-25 DIAGNOSIS — E782 Mixed hyperlipidemia: Secondary | ICD-10-CM | POA: Diagnosis not present

## 2024-05-27 ENCOUNTER — Ambulatory Visit: Payer: Self-pay | Admitting: Internal Medicine

## 2024-05-27 LAB — MICROALBUMIN / CREATININE URINE RATIO
Creatinine, Urine: 174.6 mg/dL
Microalb/Creat Ratio: 47 mg/g{creat} — ABNORMAL HIGH (ref 0–29)
Microalbumin, Urine: 81.8 ug/mL

## 2024-09-26 ENCOUNTER — Ambulatory Visit: Admitting: Internal Medicine
